# Patient Record
Sex: Male | Born: 1960
Health system: Southern US, Community
[De-identification: ages and names within clinical notes are randomized; demographics above are authoritative.]

## PROBLEM LIST (undated history)

## (undated) DIAGNOSIS — G8929 Other chronic pain: Secondary | ICD-10-CM

## (undated) DIAGNOSIS — I10 Essential (primary) hypertension: Secondary | ICD-10-CM

## (undated) DIAGNOSIS — R519 Headache, unspecified: Secondary | ICD-10-CM

## (undated) DIAGNOSIS — R079 Chest pain, unspecified: Secondary | ICD-10-CM

## (undated) DIAGNOSIS — E785 Hyperlipidemia, unspecified: Secondary | ICD-10-CM

## (undated) HISTORY — DX: Other chronic pain: G89.29

## (undated) HISTORY — DX: Headache, unspecified: R51.9

## (undated) HISTORY — PX: COLONOSCOPY: SHX174

## (undated) HISTORY — DX: Chest pain, unspecified: R07.9

## (undated) HISTORY — PX: NO PAST SURGERIES: SHX2092

## (undated) HISTORY — DX: Hyperlipidemia, unspecified: E78.5

---

## 2015-10-29 ENCOUNTER — Ambulatory Visit (HOSPITAL_COMMUNITY)
Admission: EM | Admit: 2015-10-29 | Discharge: 2015-10-29 | Disposition: A | Discharge status: Home / Self Care | Payer: BLUE CROSS/BLUE SHIELD | Attending: Emergency Medicine | Admitting: Emergency Medicine

## 2015-10-29 ENCOUNTER — Encounter (HOSPITAL_COMMUNITY): Payer: Self-pay | Admitting: Emergency Medicine

## 2015-10-29 DIAGNOSIS — I10 Essential (primary) hypertension: Secondary | ICD-10-CM | POA: Diagnosis not present

## 2015-10-29 HISTORY — DX: Essential (primary) hypertension: I10

## 2015-10-29 MED ORDER — DOXAZOSIN MESYLATE 8 MG PO TABS: 8.0000 mg | ORAL_TABLET | Freq: Every day | ORAL | Status: DC

## 2015-10-29 MED ORDER — SILDENAFIL CITRATE 50 MG PO TABS: 50.0000 mg | ORAL_TABLET | Freq: Every day | ORAL | Status: DC | PRN

## 2015-10-29 NOTE — Discharge Instructions (Signed)
DASH Eating Plan °DASH stands for "Dietary Approaches to Stop Hypertension." The DASH eating plan is a healthy eating plan that has been shown to reduce high blood pressure (hypertension). Additional health benefits may include reducing the risk of type 2 diabetes mellitus, heart disease, and stroke. The DASH eating plan may also help with weight loss. °WHAT DO I NEED TO KNOW ABOUT THE DASH EATING PLAN? °For the DASH eating plan, you will follow these general guidelines: °· Choose foods with a percent daily value for sodium of less than 5% (as listed on the food label). °· Use salt-free seasonings or herbs instead of table salt or sea salt. °· Check with your health care provider or pharmacist before using salt substitutes. °· Eat lower-sodium products, often labeled as "lower sodium" or "no salt added." °· Eat fresh foods. °· Eat more vegetables, fruits, and low-fat dairy products. °· Choose whole grains. Look for the word "whole" as the first word in the ingredient list. °· Choose fish and skinless chicken or turkey more often than red meat. Limit fish, poultry, and meat to 6 oz (170 g) each day. °· Limit sweets, desserts, sugars, and sugary drinks. °· Choose heart-healthy fats. °· Limit cheese to 1 oz (28 g) per day. °· Eat more home-cooked food and less restaurant, buffet, and fast food. °· Limit fried foods. °· Cook foods using methods other than frying. °· Limit canned vegetables. If you do use them, rinse them well to decrease the sodium. °· When eating at a restaurant, ask that your food be prepared with less salt, or no salt if possible. °WHAT FOODS CAN I EAT? °Seek help from a dietitian for individual calorie needs. °Grains °Whole grain or whole wheat bread. Brown rice. Whole grain or whole wheat pasta. Quinoa, bulgur, and whole grain cereals. Low-sodium cereals. Corn or whole wheat flour tortillas. Whole grain cornbread. Whole grain crackers. Low-sodium crackers. °Vegetables °Fresh or frozen vegetables  (raw, steamed, roasted, or grilled). Low-sodium or reduced-sodium tomato and vegetable juices. Low-sodium or reduced-sodium tomato sauce and paste. Low-sodium or reduced-sodium canned vegetables.  °Fruits °All fresh, canned (in natural juice), or frozen fruits. °Meat and Other Protein Products °Ground beef (85% or leaner), grass-fed beef, or beef trimmed of fat. Skinless chicken or turkey. Ground chicken or turkey. Pork trimmed of fat. All fish and seafood. Eggs. Dried beans, peas, or lentils. Unsalted nuts and seeds. Unsalted canned beans. °Dairy °Low-fat dairy products, such as skim or 1% milk, 2% or reduced-fat cheeses, low-fat ricotta or cottage cheese, or plain low-fat yogurt. Low-sodium or reduced-sodium cheeses. °Fats and Oils °Tub margarines without trans fats. Light or reduced-fat mayonnaise and salad dressings (reduced sodium). Avocado. Safflower, olive, or canola oils. Natural peanut or almond butter. °Other °Unsalted popcorn and pretzels. °The items listed above may not be a complete list of recommended foods or beverages. Contact your dietitian for more options. °WHAT FOODS ARE NOT RECOMMENDED? °Grains °White bread. White pasta. White rice. Refined cornbread. Bagels and croissants. Crackers that contain trans fat. °Vegetables °Creamed or fried vegetables. Vegetables in a cheese sauce. Regular canned vegetables. Regular canned tomato sauce and paste. Regular tomato and vegetable juices. °Fruits °Dried fruits. Canned fruit in light or heavy syrup. Fruit juice. °Meat and Other Protein Products °Fatty cuts of meat. Ribs, chicken wings, bacon, sausage, bologna, salami, chitterlings, fatback, hot dogs, bratwurst, and packaged luncheon meats. Salted nuts and seeds. Canned beans with salt. °Dairy °Whole or 2% milk, cream, half-and-half, and cream cheese. Whole-fat or sweetened yogurt. Full-fat   cheeses or blue cheese. Nondairy creamers and whipped toppings. Processed cheese, cheese spreads, or cheese  curds. °Condiments °Onion and garlic salt, seasoned salt, table salt, and sea salt. Canned and packaged gravies. Worcestershire sauce. Tartar sauce. Barbecue sauce. Teriyaki sauce. Soy sauce, including reduced sodium. Steak sauce. Fish sauce. Oyster sauce. Cocktail sauce. Horseradish. Ketchup and mustard. Meat flavorings and tenderizers. Bouillon cubes. Hot sauce. Tabasco sauce. Marinades. Taco seasonings. Relishes. °Fats and Oils °Butter, stick margarine, lard, shortening, ghee, and bacon fat. Coconut, palm kernel, or palm oils. Regular salad dressings. °Other °Pickles and olives. Salted popcorn and pretzels. °The items listed above may not be a complete list of foods and beverages to avoid. Contact your dietitian for more information. °WHERE CAN I FIND MORE INFORMATION? °[employer], [employer]: www.nhlbi.nih.gov/health/health-topics/topics/dash/ °  °This information is not intended to replace advice given to you by your health care provider. Make sure you discuss any questions you have with your health care provider. °  °Document Released: 05/18/2011 Document Revised: 06/19/2014 Document Reviewed: 04/02/2013 °Elsevier Interactive Patient Education ©2016 [employer] ° °Hypertension °Hypertension, commonly called high blood pressure, is when the force of blood pumping through your arteries is too strong. Your arteries are the blood vessels that carry blood from your heart throughout your body. A blood pressure reading consists of a higher number over a lower number, such as 110/72. The higher number (systolic) is the pressure inside your arteries when your heart pumps. The lower number (diastolic) is the pressure inside your arteries when your heart relaxes. Ideally you want your blood pressure below 120/80. °Hypertension forces your heart to work harder to pump blood. Your arteries may become narrow or stiff. Having untreated or uncontrolled hypertension can cause heart attack, stroke, kidney  disease, and other problems. °RISK FACTORS °Some risk factors for high blood pressure are controllable. Others are not.  °Risk factors you cannot control include:  °· Race. You may be at higher risk if you are African American. °· Age. Risk increases with age. °· Gender. Men are at higher risk than women before age 45 years. After age 65, women are at higher risk than men. °Risk factors you can control include: °· Not getting enough exercise or physical activity. °· Being overweight. °· Getting too much fat, sugar, calories, or salt in your diet. °· Drinking too much alcohol. °SIGNS AND SYMPTOMS °Hypertension does not usually cause signs or symptoms. Extremely high blood pressure (hypertensive crisis) may cause headache, anxiety, shortness of breath, and nosebleed. °DIAGNOSIS °To check if you have hypertension, your health care provider will measure your blood pressure while you are seated, with your arm held at the level of your heart. It should be measured at least twice using the same arm. Certain conditions can cause a difference in blood pressure between your right and left arms. A blood pressure reading that is higher than normal on one occasion does not mean that you need treatment. If it is not clear whether you have high blood pressure, you may be asked to return on a different day to have your blood pressure checked again. Or, you may be asked to monitor your blood pressure at home for 1 or more weeks. °TREATMENT °Treating high blood pressure includes making lifestyle changes and possibly taking medicine. Living a healthy lifestyle can help lower high blood pressure. You may need to change some of your habits. °Lifestyle changes may include: °· Following the DASH diet. This diet is high in fruits, vegetables, and whole   grains. It is low in salt, red meat, and added sugars. °· Keep your sodium intake below 2,300 mg per day. °· Getting at least 30-45 minutes of aerobic exercise at least 4 times per  week. °· Losing weight if necessary. °· Not smoking. °· Limiting alcoholic beverages. °· Learning ways to reduce stress. °Your health care provider may prescribe medicine if lifestyle changes are not enough to get your blood pressure under control, and if one of the following is true: °· You are 18-59 years of age and your systolic blood pressure is above 140. °· You are 60 years of age or older, and your systolic blood pressure is above 150. °· Your diastolic blood pressure is above 90. °· You have diabetes, and your systolic blood pressure is over 140 or your diastolic blood pressure is over 90. °· You have kidney disease and your blood pressure is above 140/90. °· You have heart disease and your blood pressure is above 140/90. °Your personal target blood pressure may vary depending on your medical conditions, your age, and other factors. °HOME CARE INSTRUCTIONS °· Have your blood pressure rechecked as directed by your health care provider.   °· Take medicines only as directed by your health care provider. Follow the directions carefully. Blood pressure medicines must be taken as prescribed. The medicine does not work as well when you skip doses. Skipping doses also puts you at risk for problems. °· Do not smoke.   °· Monitor your blood pressure at home as directed by your health care provider.  °SEEK MEDICAL CARE IF:  °· You think you are having a reaction to medicines taken. °· You have recurrent headaches or feel dizzy. °· You have swelling in your ankles. °· You have trouble with your vision. °SEEK IMMEDIATE MEDICAL CARE IF: °· You develop a severe headache or confusion. °· You have unusual weakness, numbness, or feel faint. °· You have severe chest or abdominal pain. °· You vomit repeatedly. °· You have trouble breathing. °MAKE SURE YOU:  °· Understand these instructions. °· Will watch your condition. °· Will get help right away if you are not doing well or get worse. °  °This information is not intended to  replace advice given to you by your health care provider. Make sure you discuss any questions you have with your health care provider. °  °Document Released: 05/29/2005 Document Revised: 10/13/2014 Document Reviewed: 03/21/2013 °Elsevier Interactive Patient Education ©2016 Else[employer] Inc. ° °

## 2015-10-29 NOTE — ED Notes (Signed)
General torso soreness.  Dizziness on Monday, headache on Monday.  Chest soreness x 1 week.  Patient actually reports multiple sites of soreness.  Movement makes soreness worse.  Denies chest pain now.  Patient ran out of blood pressure medicine unknown length of time

## 2015-10-29 NOTE — ED Provider Notes (Signed)
CSN: [id number]     Arrival date & time 10/29/15  1645 History   First MD Initiated Contact with Patient 10/29/15 1809     Chief Complaint  Patient presents with  . Dizziness  . Generalized Body Aches   (Consider location/radiation/quality/duration/timing/severity/associated sxs/prior Treatment) HPI Patient presents with multiple complaints including body aches chest pain and dizziness.  Present for several days by patient's report. 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 He is also out of his antihypertensive medicine. He states he has been out for a couple weeks now. States that his PCP is retired and he does not have a new doctor at this time.   Past Medical History  Diagnosis Date  . Hypertension    History reviewed. No pertinent past surgical history. No family history on file. Social History  Substance Use Topics  . Smoking status: Never Smoker   . Smokeless tobacco: None  . Alcohol Use: Yes    Review of Systems  Denies: HEADACHE, NAUSEA, ABDOMINAL PAIN, CHEST PAIN, CONGESTION, DYSURIA, SHORTNESS OF BREATH  Allergies  Review of patient's allergies indicates no known allergies.  Home Medications   Prior to Admission medications   Not on File   Meds Ordered and Administered this Visit  Medications - No data to display  BP 160/80 mmHg  Pulse 63  Temp(Src) 97.5 F (36.4 C) (Oral)  Resp 16  SpO2 98% No data found.   Physical Exam NURSES NOTES AND VITAL SIGNS REVIEWED. CONSTITUTIONAL: Well developed, well nourished, no acute distress HEENT: normocephalic, atraumatic EYES: Conjunctiva normal NECK:normal ROM, supple, no adenopathy PULMONARY:No respiratory distress, normal effort ABDOMINAL: Soft, ND, NT BS+, No CVAT MUSCULOSKELETAL: Normal ROM of all extremities,  SKIN: warm and dry without rash PSYCHIATRIC: Mood and affect, behavior are normal  ED Course  Procedures (including critical care time)  Labs Review Labs Reviewed - No  data to display  Imaging Review No results found.   Visual Acuity Review  Right Eye Distance:   Left Eye Distance:   Bilateral Distance:    Right Eye Near:   Left Eye Near:    Bilateral Near:      I HAVE PERSONALLY REVIEWED ECG AND DISCUSSED FINDINGS WITH PATIENT.   PA UC INTERPRETATION: NORMAL SINUS RHYTHM WITH RATE OF 58, NO AXIS, OR INTERVAL ABNORMALITIES. NO ACUTE CHANGES.  NO OLD ECG TO COMPARE.  ECG UNCHANGED AS COMPARED TO:  rx FOR DOXAZOSIN 8 MG  MDM   1. Essential hypertension     Patient is reassured that there are no issues that require transfer to higher level of care at this time or additional tests. Patient is advised to continue home symptomatic treatment. Patient is advised that if there are new or worsening symptoms to attend the emergency department, contact primary care provider, or return to UC. Instructions of care provided discharged home in stable condition.    THIS NOTE WAS GENERATED USING A VOICE RECOGNITION SOFTWARE PROGRAM. ALL REASONABLE EFFORTS  WERE MADE TO PROOFREAD THIS DOCUMENT FOR ACCURACY.  I have verbally reviewed the discharge instructions with the patient. A printed AVS was given to the patient.  All questions were answered prior to discharge.      Konrad Felix, PA 10/29/15 [place], [place] 10/29/15 [phone]

## 2017-05-02 ENCOUNTER — Ambulatory Visit (HOSPITAL_COMMUNITY)
Admission: EM | Admit: 2017-05-02 | Discharge: 2017-05-02 | Disposition: A | Discharge status: Home / Self Care | Payer: BLUE CROSS/BLUE SHIELD | Attending: Family Medicine | Admitting: Family Medicine

## 2017-05-02 ENCOUNTER — Encounter (HOSPITAL_COMMUNITY): Payer: Self-pay | Admitting: Emergency Medicine

## 2017-05-02 DIAGNOSIS — R42 Dizziness and giddiness: Secondary | ICD-10-CM

## 2017-05-02 DIAGNOSIS — H81399 Other peripheral vertigo, unspecified ear: Secondary | ICD-10-CM

## 2017-05-02 DIAGNOSIS — R110 Nausea: Secondary | ICD-10-CM

## 2017-05-02 MED ORDER — MECLIZINE HCL 25 MG PO TABS: 25.0000 mg | ORAL_TABLET | Freq: Three times a day (TID) | ORAL | 0 refills | Status: DC | PRN

## 2017-05-02 NOTE — ED Provider Notes (Signed)
[place]    CSN: [id number] Arrival date & time: 05/02/17  1727     History   Chief Complaint Chief Complaint  Patient presents with  . Dizziness    HPI Gregory Benson is a 56 y.o. male.   Gregory Benson presents with complaints of dizziness and nausea which started today at approximately 1400. He states movement and position change caused a spinning sensation. Mild headache. Took blood pressure medication this afternoon. He did drive here, he is ambulatory. Has not vomited. Denies ear pain. Small runny nose. No recent travel. Denies previous similar but states if he drinks coffee it causes dizziness. Denies caffeine intake today. States he may have intermittent chest pain today but denies currently. Has not taken any medications for his symptoms. Has been eating and drinking, without vomiting or diarrhea.   ROS per HPI.       Past Medical History:  Diagnosis Date  . Hypertension     There are no active problems to display for this patient.   History reviewed. No pertinent surgical history.     Home Medications    Prior to Admission medications   Medication Sig Start Date End Date Taking? Authorizing Provider  doxazosin (CARDURA) 8 MG tablet Take 1 tablet (8 mg total) by mouth daily. 10/29/15   Konrad Felix, PA  meclizine (ANTIVERT) 25 MG tablet Take 1 tablet (25 mg total) by mouth 3 (three) times daily as needed for dizziness. 05/02/17   Zigmund Gottron, NP  sildenafil (VIAGRA) 50 MG tablet Take 1 tablet (50 mg total) by mouth daily as needed for erectile dysfunction. 10/29/15   Konrad Felix, PA    Family History History reviewed. No pertinent family history.  Social History Social History   Tobacco Use  . Smoking status: Never Smoker  Substance Use Topics  . Alcohol use: Yes  . Drug use: No     Allergies   Patient has no known allergies.   Review of Systems Review of Systems   Physical Exam Triage Vital Signs ED Triage Vitals  Enc  Vitals Group     BP 05/02/17 1743 (!) 165/91     Pulse Rate 05/02/17 1743 (!) 59     Resp 05/02/17 1743 18     Temp 05/02/17 1743 (!) 97.2 F (36.2 C)     Temp Source 05/02/17 1743 Oral     SpO2 05/02/17 1743 100 %     Weight --      Height --      Head Circumference --      Peak Flow --      Pain Score 05/02/17 1744 0     Pain Loc --      Pain Edu? --      Excl. in [place]? --    Orthostatic VS for the past 24 hrs:  BP- Lying Pulse- Lying BP- Sitting Pulse- Sitting BP- Standing at 0 minutes Pulse- Standing at 0 minutes  05/02/17 1825 138/85 58 168/89 56 151/84 61    Updated Vital Signs BP (!) 165/91 (BP Location: Right Arm)   Pulse (!) 59   Temp (!) 97.2 F (36.2 C) (Oral)   Resp 18   SpO2 100%   Visual Acuity Right Eye Distance:   Left Eye Distance:   Bilateral Distance:    Right Eye Near:   Left Eye Near:    Bilateral Near:     Physical Exam  Constitutional: He is oriented to person, place, and  time. He appears well-developed and well-nourished.  HENT:  Head: Normocephalic and atraumatic.  Right Ear: Tympanic membrane and ear canal normal.  Left Ear: Tympanic membrane and ear canal normal.  Mouth/Throat: Uvula is midline, oropharynx is clear and moist and mucous membranes are normal.  Eyes: Conjunctivae are normal. Pupils are equal, round, and reactive to light.  Neck: Normal range of motion.  Cardiovascular: Regular rhythm.  Pulmonary/Chest: Effort normal and breath sounds normal.  Neurological: He is alert and oriented to person, place, and time. No cranial nerve deficit or sensory deficit. Coordination normal.  Tolerated romberg; ambulatory; denies increased dizziness from sitting to standing during exam  Skin: Skin is warm and dry.  Vitals reviewed.   Negative orthostatic vitals. ekg with bradycardia, without acute findings. No changes from ekg from 10/2015   UC Treatments / Results  Labs (all labs ordered are listed, but only abnormal results are  displayed) Labs Reviewed - No data to display  EKG  EKG Interpretation None       Radiology No results found.  Procedures Procedures (including critical care time)  Medications Ordered in UC Medications - No data to display   Initial Impression / Assessment and Plan / UC Course  I have reviewed the triage vital signs and the nursing notes.  Pertinent labs & imaging results that were available during my care of the patient were reviewed by me and considered in my medical decision making (see chart for details).     Patient non toxic non distressed in appearance throughout exam. Tolerated orthostatic vitals without increased dizziness or without symptoms. ekg without acute changes, consistent with previous ekg. HR bradycardic, appears consistent with visit in 2017. BP improvement, noted with orthostatic vitals. Will try meclizine, push fluids. Return precautions provided. Patient verbalized understanding and agreeable to plan.  If symptoms worsen or do not improve in the next week to return to be seen or to follow up with PCP. Ambulatory out of clinic without difficulty.     Final Clinical Impressions(s) / UC Diagnoses   Final diagnoses:  Vertigo    ED Discharge Orders        Ordered    meclizine (ANTIVERT) 25 MG tablet  3 times daily PRN     05/02/17 1846       Controlled Substance Prescriptions [place] Controlled Substance Registry consulted? Not Applicable   Zigmund Gottron, NP 05/02/17 [phone]

## 2017-05-02 NOTE — Discharge Instructions (Signed)
Please drink plenty of water. May try the medication I have sent to the pharmacy to treat dizziness, may cause drowsiness. Rest. If develop worsening of symptoms, headache, chest pain, loss of consciousness, vomiting please visit Ed. If no improvement of symptoms please return to be seen or establish with a primary care provider.

## 2017-05-02 NOTE — ED Triage Notes (Signed)
Pt sts dizziness x several hours worse with position change and feels that room is spinning; pt sts some nausea also; no other sx

## 2017-07-30 ENCOUNTER — Encounter (HOSPITAL_COMMUNITY): Payer: Self-pay | Admitting: Emergency Medicine

## 2017-07-30 ENCOUNTER — Ambulatory Visit (HOSPITAL_COMMUNITY)
Admission: EM | Admit: 2017-07-30 | Discharge: 2017-07-30 | Disposition: A | Discharge status: Home / Self Care | Payer: Self-pay | Attending: Family Medicine | Admitting: Family Medicine

## 2017-07-30 DIAGNOSIS — Z760 Encounter for issue of repeat prescription: Secondary | ICD-10-CM

## 2017-07-30 DIAGNOSIS — I10 Essential (primary) hypertension: Secondary | ICD-10-CM

## 2017-07-30 MED ORDER — DOXAZOSIN MESYLATE 8 MG PO TABS: 8.0000 mg | ORAL_TABLET | Freq: Every day | ORAL | 0 refills | Status: DC

## 2017-07-30 MED ORDER — TADALAFIL 10 MG PO TABS: 10.0000 mg | ORAL_TABLET | Freq: Every day | ORAL | 0 refills | Status: DC | PRN

## 2017-07-30 NOTE — ED Provider Notes (Signed)
Gregory Benson    CSN: [id number] Arrival date & time: 07/30/17  1004     History   Chief Complaint Chief Complaint  Patient presents with  . Medication Refill    HPI Gregory Benson is a 57 y.o. male history of hypertension presenting today for a medication refill.  He takes 8 mg of doxazosin daily.  He ran out of this a few days ago.  He denies any issues with his medications at this time.  He denies headache, vision changes, chest pain, shortness of breath, decreased urine output.  Patient will occasionally check his blood pressure at [facility] and it runs in the 120s over 60s.  He is also requesting a different medicine for erectile dysfunction, he was given Viagra but states this this was too expensive for him.  HPI  Past Medical History:  Diagnosis Date  . Hypertension     There are no active problems to display for this patient.   History reviewed. No pertinent surgical history.     Home Medications    Prior to Admission medications   Medication Sig Start Date End Date Taking? Authorizing Provider  doxazosin (CARDURA) 8 MG tablet Take 1 tablet (8 mg total) by mouth daily. 07/30/17 08/29/17  Wieters, Hallie C, PA-C  meclizine (ANTIVERT) 25 MG tablet Take 1 tablet (25 mg total) by mouth 3 (three) times daily as needed for dizziness. 05/02/17   Zigmund Gottron, NP  sildenafil (VIAGRA) 50 MG tablet Take 1 tablet (50 mg total) by mouth daily as needed for erectile dysfunction. 10/29/15   Konrad Felix, PA  tadalafil (CIALIS) 10 MG tablet Take 1 tablet (10 mg total) by mouth daily as needed for erectile dysfunction. 07/30/17   Wieters, Elesa Hacker, PA-C    Family History No family history on file.  Social History Social History   Tobacco Use  . Smoking status: Never Smoker  Substance Use Topics  . Alcohol use: Yes  . Drug use: No     Allergies   Patient has no known allergies.   Review of Systems Review of Systems  Constitutional: Negative for fever.    Eyes: Negative for pain and visual disturbance.  Respiratory: Negative for cough and shortness of breath.   Cardiovascular: Negative for chest pain and palpitations.  Gastrointestinal: Negative for abdominal pain and vomiting.  Genitourinary: Negative for decreased urine volume and hematuria.  Musculoskeletal: Positive for back pain and neck pain.  Skin: Negative for color change and rash.  Neurological: Negative for weakness, light-headedness and headaches.  All other systems reviewed and are negative.    Physical Exam Triage Vital Signs ED Triage Vitals  Enc Vitals Group     BP 07/30/17 1031 (!) 138/94     Pulse Rate 07/30/17 1031 69     Resp 07/30/17 1031 18     Temp 07/30/17 1031 97.7 F (36.5 C)     Temp Source 07/30/17 1031 Oral     SpO2 07/30/17 1031 98 %     Weight 07/30/17 1037 180 lb (81.6 kg)     Height --      Head Circumference --      Peak Flow --      Pain Score 07/30/17 1036 0     Pain Loc --      Pain Edu? --      Excl. in [place]? --    No data found.  Updated Vital Signs BP (!) 138/94 (BP Location: Left Arm) Comment: Pt  in for BP med refill  Pulse 69   Temp 97.7 F (36.5 C) (Oral)   Resp 18   Wt 180 lb (81.6 kg)   SpO2 98%   Visual Acuity Right Eye Distance:   Left Eye Distance:   Bilateral Distance:    Right Eye Near:   Left Eye Near:    Bilateral Near:     Physical Exam  Constitutional: He is oriented to person, place, and time. He appears well-developed and well-nourished.  HENT:  Head: Normocephalic and atraumatic.  Eyes: Conjunctivae and EOM are normal. Pupils are equal, round, and reactive to light.  Neck: Neck supple.  Cardiovascular: Normal rate and regular rhythm.  No murmur heard. Pulmonary/Chest: Effort normal and breath sounds normal. No respiratory distress.  Breathing comfortably at rest, clear to auscultation bilaterally  Abdominal: Soft. There is no tenderness.  Musculoskeletal: He exhibits no edema.  Neurological: He is  alert and oriented to person, place, and time.  Skin: Skin is warm and dry.  Psychiatric: He has a normal mood and affect.  Nursing note and vitals reviewed.    UC Treatments / Results  Labs (all labs ordered are listed, but only abnormal results are displayed) Labs Reviewed - No data to display  EKG  EKG Interpretation None       Radiology No results found.  Procedures Procedures (including critical care time)  Medications Ordered in UC Medications - No data to display   Initial Impression / Assessment and Plan / UC Course  I have reviewed the triage vital signs and the nursing notes.  Pertinent labs & imaging results that were available during my care of the patient were reviewed by me and considered in my medical decision making (see chart for details).     Cardura refilled.  Advised him to try to establish care with a primary care provider.  Provided information for community health and wellness.  Patient is currently asymptomatic, blood pressure today 138/94.   Will provide generic Cialis as an alternative to Viagra.  We will see if this is any cheaper for him.  Advised Tylenol and ibuprofen for any neck and back pain he has from working and lifting.  Discussed strict return precautions. Patient verbalized understanding and is agreeable with plan.   Final Clinical Impressions(s) / UC Diagnoses   Final diagnoses:  Medication refill  Essential hypertension    ED Discharge Orders        Ordered    doxazosin (CARDURA) 8 MG tablet  Daily,   Status:  Discontinued     07/30/17 1049    tadalafil (CIALIS) 10 MG tablet  Daily PRN     07/30/17 1049    doxazosin (CARDURA) 8 MG tablet  Daily     07/30/17 1053       Controlled Substance Prescriptions [place] Controlled Substance Registry consulted? Not Applicable   Janith Lima, PA-C 07/30/17 [address], [place], [place] 07/30/17 1059

## 2017-07-30 NOTE — ED Triage Notes (Addendum)
PT ran out of BP meds a few days ago, needs refill  PT would also like a med for ED, he was prescribed viagra but could not afford it.  Would like referral to PCP

## 2017-07-30 NOTE — Discharge Instructions (Signed)
I have refilled your blood pressure medicine. Please continue to take as prescribed and monitor blood pressure at home.   Please take tylenol and/or Ibuprofen for your neck/back aches.  I have sent in another medicine for your ED- please see if this is any cheaper than Viagra. Take as needed 30 minutes prior to sexual activity.   Please try and get set up with a primary care, you may contact community health and wellness (info below).

## 2017-10-22 ENCOUNTER — Ambulatory Visit (HOSPITAL_COMMUNITY)
Admission: EM | Admit: 2017-10-22 | Discharge: 2017-10-22 | Disposition: A | Discharge status: Home / Self Care | Payer: Self-pay | Attending: Family Medicine | Admitting: Family Medicine

## 2017-10-22 ENCOUNTER — Encounter (HOSPITAL_COMMUNITY): Payer: Self-pay | Admitting: Family Medicine

## 2017-10-22 DIAGNOSIS — Z760 Encounter for issue of repeat prescription: Secondary | ICD-10-CM

## 2017-10-22 DIAGNOSIS — I10 Essential (primary) hypertension: Secondary | ICD-10-CM

## 2017-10-22 MED ORDER — DOXAZOSIN MESYLATE 8 MG PO TABS: 8.0000 mg | ORAL_TABLET | Freq: Every day | ORAL | 2 refills | Status: DC

## 2017-10-22 NOTE — ED Triage Notes (Signed)
Pt here for med refill. He is out of BP meds x 2 month.

## 2017-10-22 NOTE — Discharge Instructions (Signed)
I have refilled your blood pressure medicine.  Be sure to monitor at home/[facility]/pharmacies  Please go to Emergency Room if you start to experience severe headache, vision changes, decreased urine production, chest pain, shortness of breath, speech slurring, one sided weakness.

## 2017-10-22 NOTE — ED Provider Notes (Signed)
[facility]    CSN: [id number] Arrival date & time: 10/22/17  1000     History   Chief Complaint Chief Complaint  Patient presents with  . Medication Refill    HPI Gregory Benson is a 57 y.o. male history of hypertension presenting today for medication refill.  Patient takes doxazosin 8 mg daily for his blood pressure.  He has been off his medicines for 3 days.  He denies any issues with his medicines.  Does note that he recently lost his mother and is dealing with grief from this.  He denies any headaches, change in vision, chest pain or difficulty breathing.  Denies any decreased urine output.  Patient does not have a PCP.  States he is going to [place] in about a month and is requesting refills to cover him during this time of travel.  HPI  Past Medical History:  Diagnosis Date  . Hypertension     There are no active problems to display for this patient.   History reviewed. No pertinent surgical history.     Home Medications    Prior to Admission medications   Medication Sig Start Date End Date Taking? Authorizing Provider  doxazosin (CARDURA) 8 MG tablet Take 1 tablet (8 mg total) by mouth daily. 10/22/17 11/21/17  Mintie Witherington C, PA-C  meclizine (ANTIVERT) 25 MG tablet Take 1 tablet (25 mg total) by mouth 3 (three) times daily as needed for dizziness. 05/02/17   Zigmund Gottron, NP  sildenafil (VIAGRA) 50 MG tablet Take 1 tablet (50 mg total) by mouth daily as needed for erectile dysfunction. 10/29/15   Konrad Felix, PA  tadalafil (CIALIS) 10 MG tablet Take 1 tablet (10 mg total) by mouth daily as needed for erectile dysfunction. 07/30/17   Braylyn Kalter, Elesa Hacker, PA-C    Family History No family history on file.  Social History Social History   Tobacco Use  . Smoking status: Never Smoker  . Smokeless tobacco: Never Used  Substance Use Topics  . Alcohol use: Yes  . Drug use: No     Allergies   Patient has no known allergies.   Review of  Systems Review of Systems  Constitutional: Negative for activity change, appetite change, fatigue and fever.  Eyes: Negative for visual disturbance.  Respiratory: Negative for chest tightness and shortness of breath.   Cardiovascular: Negative for chest pain.  Gastrointestinal: Negative for abdominal pain, nausea and vomiting.  Genitourinary: Negative for decreased urine volume.  Musculoskeletal: Negative for myalgias.  Skin: Negative for rash.  Neurological: Negative for syncope, weakness, numbness and headaches.     Physical Exam Triage Vital Signs ED Triage Vitals [10/22/17 1013]  Enc Vitals Group     BP      Pulse      Resp      Temp      Temp src      SpO2      Weight      Height      Head Circumference      Peak Flow      Pain Score 0     Pain Loc      Pain Edu?      Excl. in [place]?    No data found.  Updated Vital Signs BP 126/81   Pulse (!) 57   Temp 98.9 F (37.2 C)   Resp 18   SpO2 98%   Visual Acuity Right Eye Distance:   Left Eye Distance:  Bilateral Distance:    Right Eye Near:   Left Eye Near:    Bilateral Near:     Physical Exam  Constitutional: He is oriented to person, place, and time. He appears well-developed and well-nourished.  Sitting comfortably on exam table, no acute distress  HENT:  Head: Normocephalic and atraumatic.  Eyes: Pupils are equal, round, and reactive to light. Conjunctivae and EOM are normal.  Neck: Neck supple.  Cardiovascular: Normal rate and regular rhythm.  No murmur heard. Pulmonary/Chest: Effort normal and breath sounds normal. No respiratory distress.  Breathing comfortably at rest, CTA BL  Abdominal: Soft. There is no tenderness.  Musculoskeletal: He exhibits no edema.  Neurological: He is alert and oriented to person, place, and time.  Skin: Skin is warm and dry.  Psychiatric: He has a normal mood and affect.  Nursing note and vitals reviewed.    UC Treatments / Results  Labs (all labs ordered are  listed, but only abnormal results are displayed) Labs Reviewed - No data to display  EKG None  Radiology No results found.  Procedures Procedures (including critical care time)  Medications Ordered in UC Medications - No data to display  Initial Impression / Assessment and Plan / UC Course  I have reviewed the triage vital signs and the nursing notes.  Pertinent labs & imaging results that were available during my care of the patient were reviewed by me and considered in my medical decision making (see chart for details).     Refill of doxazosin provided, blood pressure appears controlled at this time.  Provided patient with community health and wellness information to establish care with a PCP. Discussed strict return precautions. Patient verbalized understanding and is agreeable with plan.  Final Clinical Impressions(s) / UC Diagnoses   Final diagnoses:  Medication refill  Essential hypertension     Discharge Instructions     I have refilled your blood pressure medicine.  Be sure to monitor at home/[facility]pharmacies  Please go to Emergency Room if you start to experience severe headache, vision changes, decreased urine production, chest pain, shortness of breath, speech slurring, one sided weakness.     ED Prescriptions    Medication Sig Dispense Auth. Provider   doxazosin (CARDURA) 8 MG tablet Take 1 tablet (8 mg total) by mouth daily. 60 tablet Ellieanna Funderburg, [place] C, PA-C     Controlled Substance Prescriptions [place] Controlled Substance Registry consulted? Not Applicable   Janith Lima, [place] 10/22/17 1033

## 2018-06-24 ENCOUNTER — Other Ambulatory Visit: Payer: Self-pay

## 2018-06-24 ENCOUNTER — Encounter (HOSPITAL_COMMUNITY): Payer: Self-pay | Admitting: Emergency Medicine

## 2018-06-24 ENCOUNTER — Ambulatory Visit (HOSPITAL_COMMUNITY)
Admission: EM | Admit: 2018-06-24 | Discharge: 2018-06-24 | Disposition: A | Discharge status: Home / Self Care | Payer: Self-pay | Attending: Urgent Care | Admitting: Urgent Care

## 2018-06-24 DIAGNOSIS — Z760 Encounter for issue of repeat prescription: Secondary | ICD-10-CM | POA: Insufficient documentation

## 2018-06-24 DIAGNOSIS — I10 Essential (primary) hypertension: Secondary | ICD-10-CM | POA: Insufficient documentation

## 2018-06-24 MED ORDER — DOXAZOSIN MESYLATE 8 MG PO TABS: 8.0000 mg | ORAL_TABLET | Freq: Every day | ORAL | 0 refills | Status: DC

## 2018-06-24 NOTE — Discharge Instructions (Addendum)
[facility] can help you with your primary care needs.  [facility] Address: [address], [place], [place] [address] Phone: [phone]   For your travel, please report to the Health Department to get your vaccines.  [facility] Department Address: [address] [place], [place] [address] Phone: [phone] Website: [email or web address]

## 2018-06-24 NOTE — ED Triage Notes (Signed)
Pt states he has been out of his BP medication for a few days.  Pt has no job or insurance so he does not have a PCP.  Pt also states he is going to [place] on Sunday for a few months and is wanting to get medication to keep him from getting Malaria while over there.

## 2018-06-24 NOTE — ED Provider Notes (Signed)
  MRN: [id number] DOB: 01/15/1961  Subjective:   Gregory Benson is a 58 y.o. male presenting for medication refill.  He is on doxazosin and does very well with this.  He is requesting a 86-month supply as he is going to [place] and does not know when he will return.  He is also requesting immunizations.  He does not have a primary care doctor, does not have insurance.  No current facility-administered medications for this encounter.   Current Outpatient Medications:  .  doxazosin (CARDURA) 8 MG tablet, Take 1 tablet (8 mg total) by mouth daily., Disp: 180 tablet, Rfl: 0 .  sildenafil (VIAGRA) 50 MG tablet, Take 1 tablet (50 mg total) by mouth daily as needed for erectile dysfunction., Disp: 10 tablet, Rfl: 0 .  tadalafil (CIALIS) 10 MG tablet, Take 1 tablet (10 mg total) by mouth daily as needed for erectile dysfunction., Disp: 15 tablet, Rfl: 0   No Known Allergies  Past Medical History:  Diagnosis Date  . Hypertension     History reviewed. No pertinent surgical history.  Objective:   Vitals: BP 117/71 (BP Location: Left Arm)   Pulse 70   Temp 99.2 F (37.3 C) (Oral)   SpO2 99%   Physical Exam Constitutional:      Appearance: Normal appearance. He is well-developed and normal weight.  HENT:     Head: Normocephalic and atraumatic.     Right Ear: External ear normal.     Left Ear: External ear normal.     Nose: Nose normal.     Mouth/Throat:     Pharynx: Oropharynx is clear.  Eyes:     Extraocular Movements: Extraocular movements intact.     Pupils: Pupils are equal, round, and reactive to light.  Cardiovascular:     Rate and Rhythm: Normal rate.  Pulmonary:     Effort: Pulmonary effort is normal.  Neurological:     Mental Status: He is alert and oriented to person, place, and time.  Psychiatric:        Mood and Affect: Mood normal.        Behavior: Behavior normal.    Assessment and Plan :   Medication refill  Essential hypertension  I counseled patient that has an  urgent care practice, we cannot safely treat his primary care needs.  I did refill his blood pressure medication for 180 days with the understanding that he would establish care with [facility] for his future primary care needs.  I redirected him to the health department to obtain immunization counseling for his travels. Return-to-clinic precautions discussed, patient verbalized understanding.    Jaynee Eagles, PA-C 06/24/18 1058

## 2019-02-27 ENCOUNTER — Encounter (HOSPITAL_COMMUNITY): Payer: Self-pay | Admitting: Emergency Medicine

## 2019-02-27 ENCOUNTER — Ambulatory Visit (HOSPITAL_COMMUNITY)
Admission: EM | Admit: 2019-02-27 | Discharge: 2019-02-27 | Disposition: A | Discharge status: Home / Self Care | Payer: Self-pay | Attending: Family Medicine | Admitting: Family Medicine

## 2019-02-27 ENCOUNTER — Other Ambulatory Visit: Payer: Self-pay

## 2019-02-27 DIAGNOSIS — R21 Rash and other nonspecific skin eruption: Secondary | ICD-10-CM

## 2019-02-27 DIAGNOSIS — N529 Male erectile dysfunction, unspecified: Secondary | ICD-10-CM

## 2019-02-27 DIAGNOSIS — I10 Essential (primary) hypertension: Secondary | ICD-10-CM

## 2019-02-27 LAB — LIPID PANEL
Cholesterol: 198 mg/dL (ref 0–200)
HDL: 60 mg/dL (ref >40)
LDL Cholesterol: 120 mg/dL — ABNORMAL HIGH (ref 0–99)
Total CHOL/HDL Ratio: 3.3 RATIO
Triglycerides: 89 mg/dL (ref <150)
VLDL: 18 mg/dL (ref 0–40)

## 2019-02-27 LAB — COMPREHENSIVE METABOLIC PANEL
ALT: 15 U/L (ref 0–44)
AST: 27 U/L (ref 15–41)
Albumin: 4 g/dL (ref 3.5–5.0)
Alkaline Phosphatase: 55 U/L (ref 38–126)
Anion gap: 7 (ref 5–15)
BUN: 12 mg/dL (ref 6–20)
CO2: 27 mmol/L (ref 22–32)
Calcium: 9.6 mg/dL (ref 8.9–10.3)
Chloride: 106 mmol/L (ref 98–111)
Creatinine, Ser: 0.96 mg/dL (ref 0.61–1.24)
GFR calc Af Amer: >60 mL/min (ref >60)
GFR calc non Af Amer: >60 mL/min (ref >60)
Glucose, Bld: 123 mg/dL — ABNORMAL HIGH (ref 70–99)
Potassium: 3.3 mmol/L — ABNORMAL LOW (ref 3.5–5.1)
Sodium: 140 mmol/L (ref 135–145)
Total Bilirubin: 1.2 mg/dL (ref 0.3–1.2)
Total Protein: 6.8 g/dL (ref 6.5–8.1)

## 2019-02-27 MED ORDER — DOXAZOSIN MESYLATE 8 MG PO TABS: 8.0000 mg | ORAL_TABLET | Freq: Every day | ORAL | 1 refills | Status: DC

## 2019-02-27 MED ORDER — SILDENAFIL CITRATE 50 MG PO TABS: 50.0000 mg | ORAL_TABLET | Freq: Every day | ORAL | 11 refills | Status: DC | PRN

## 2019-02-27 MED ORDER — CLOTRIMAZOLE-BETAMETHASONE 1-0.05 % EX CREA: TOPICAL_CREAM | CUTANEOUS | 1 refills | Status: DC

## 2019-02-27 NOTE — ED Provider Notes (Signed)
Gregory Benson    CSN: [id number] Arrival date & time: 02/27/19  1121      History   Chief Complaint Chief Complaint  Patient presents with  . Medication Reaction  . Rash    HPI Gregory Benson is a 58 y.o. male.    Pt requesting medication refill on his blood pressure medicine. Also states he had his hair cut at the barber shop and broke out with dry skin/rash on top of his head.   Patient has been here before for blood pressure check.  Today he has a rash on his scalp which is been going on for a number of months.  It is itchy and scaly.  He is not using any medications for present.  Patient also has erectile dysfunction.  This is an ongoing problem for which Viagra has worked for him.  Patient also has asked about getting blood work to make sure that his "internal organs" are not being affected by the hypertension.  Patient works for [employer] but is been furloughed.       Past Medical History:  Diagnosis Date  . Hypertension     There are no active problems to display for this patient.   History reviewed. No pertinent surgical history.     Home Medications    Prior to Admission medications   Medication Sig Start Date End Date Taking? Authorizing Provider  clotrimazole-betamethasone (LOTRISONE) cream Apply to affected area 2 times daily prn 02/27/19   Robyn Haber, MD  doxazosin (CARDURA) 8 MG tablet Take 1 tablet (8 mg total) by mouth daily. 02/27/19   Robyn Haber, MD  sildenafil (VIAGRA) 50 MG tablet Take 1 tablet (50 mg total) by mouth daily as needed for erectile dysfunction. 02/27/19   Robyn Haber, MD  tadalafil (CIALIS) 10 MG tablet Take 1 tablet (10 mg total) by mouth daily as needed for erectile dysfunction. 07/30/17   Wieters, Elesa Hacker, PA-C    Family History No family history on file.  Social History Social History   Tobacco Use  . Smoking status: Never Smoker  . Smokeless tobacco: Never Used  Substance Use Topics  .  Alcohol use: Yes  . Drug use: No     Allergies   Patient has no known allergies.   Review of Systems Review of Systems  Skin: Positive for rash.  All other systems reviewed and are negative.    Physical Exam Triage Vital Signs ED Triage Vitals  Enc Vitals Group     BP 02/27/19 1143 139/82     Pulse Rate 02/27/19 1143 88     Resp 02/27/19 1143 18     Temp 02/27/19 1143 98 F (36.7 C)     Temp src --      SpO2 02/27/19 1143 100 %     Weight --      Height --      Head Circumference --      Peak Flow --      Pain Score 02/27/19 1145 0     Pain Loc --      Pain Edu? --      Excl. in [place]? --    No data found.  Updated Vital Signs BP 139/82   Pulse 88   Temp 98 F (36.7 C)   Resp 18   SpO2 100%    Physical Exam Vitals signs and nursing note reviewed.  Constitutional:      General: He is not in acute distress.  Appearance: Normal appearance. He is normal weight. He is not ill-appearing.  HENT:     Head: Atraumatic.     Mouth/Throat:     Mouth: Mucous membranes are moist.  Eyes:     Conjunctiva/sclera: Conjunctivae normal.  Neck:     Musculoskeletal: Normal range of motion and neck supple.     Vascular: No carotid bruit.  Cardiovascular:     Rate and Rhythm: Normal rate and regular rhythm.     Pulses: Normal pulses.     Heart sounds: Normal heart sounds.  Pulmonary:     Effort: Pulmonary effort is normal.     Breath sounds: Normal breath sounds.  Musculoskeletal: Normal range of motion.  Skin:    General: Skin is warm and dry.     Findings: Rash present.     Comments: Diffuse scalp rash under close cropped hair:  Scaly excoriated diffusely  Neurological:     General: No focal deficit present.     Mental Status: He is alert.  Psychiatric:        Mood and Affect: Mood normal.      UC Treatments / Results  Labs (all labs ordered are listed, but only abnormal results are displayed) Labs Reviewed  COMPREHENSIVE METABOLIC PANEL  LIPID PANEL     EKG   Radiology No results found.  Procedures Procedures (including critical care time)  Medications Ordered in UC Medications - No data to display  Initial Impression / Assessment and Plan / UC Course  I have reviewed the triage vital signs and the nursing notes.  Pertinent labs & imaging results that were available during my care of the patient were reviewed by me and considered in my medical decision making (see chart for details).    Final Clinical Impressions(s) / UC Diagnoses   Final diagnoses:  Rash  Essential hypertension  Erectile dysfunction, unspecified erectile dysfunction type   Discharge Instructions   None    ED Prescriptions    Medication Sig Dispense Auth. Provider   doxazosin (CARDURA) 8 MG tablet Take 1 tablet (8 mg total) by mouth daily. 180 tablet Robyn Haber, MD   sildenafil (VIAGRA) 50 MG tablet Take 1 tablet (50 mg total) by mouth daily as needed for erectile dysfunction. 10 tablet Robyn Haber, MD   clotrimazole-betamethasone (LOTRISONE) cream Apply to affected area 2 times daily prn 45 g Robyn Haber, MD     Controlled Substance Prescriptions [place] Controlled Substance Registry consulted? Not Applicable   Robyn Haber, MD 02/27/19 1159

## 2019-02-27 NOTE — ED Triage Notes (Signed)
Pt requesting medication refill on his blood pressure medicine. Also states he had his hair cut at the barber shop and broke out with dry skin/rash on top of his head.

## 2019-02-28 ENCOUNTER — Telehealth (HOSPITAL_COMMUNITY): Payer: Self-pay | Admitting: Emergency Medicine

## 2019-02-28 NOTE — Telephone Encounter (Signed)
Per Dr. Meda Coffee, pt needs increase potassium in diet. Attempted to reach patient. No answer at this time. Voicemail left.

## 2019-03-03 ENCOUNTER — Telehealth (HOSPITAL_COMMUNITY): Payer: Self-pay | Admitting: Emergency Medicine

## 2019-03-03 NOTE — Telephone Encounter (Signed)
Attempted once more to contact patient about bloodwork, no answer.

## 2019-07-23 ENCOUNTER — Other Ambulatory Visit: Payer: Self-pay

## 2019-07-23 ENCOUNTER — Encounter (HOSPITAL_COMMUNITY): Payer: Self-pay

## 2019-07-23 ENCOUNTER — Ambulatory Visit (HOSPITAL_COMMUNITY)
Admission: EM | Admit: 2019-07-23 | Discharge: 2019-07-23 | Disposition: A | Discharge status: Home / Self Care | Payer: 59 | Attending: Physician Assistant | Admitting: Physician Assistant

## 2019-07-23 DIAGNOSIS — M62830 Muscle spasm of back: Secondary | ICD-10-CM | POA: Diagnosis present

## 2019-07-23 DIAGNOSIS — M545 Low back pain, unspecified: Secondary | ICD-10-CM

## 2019-07-23 DIAGNOSIS — Z20822 Contact with and (suspected) exposure to covid-19: Secondary | ICD-10-CM

## 2019-07-23 MED ORDER — MELOXICAM 7.5 MG PO TABS: 7.5000 mg | ORAL_TABLET | Freq: Every day | ORAL | 0 refills | Status: DC

## 2019-07-23 MED ORDER — TIZANIDINE HCL 4 MG PO TABS: 4.0000 mg | ORAL_TABLET | Freq: Every evening | ORAL | 0 refills | Status: DC | PRN

## 2019-07-23 MED ORDER — SILDENAFIL CITRATE 50 MG PO TABS: 50.0000 mg | ORAL_TABLET | ORAL | 1 refills | Status: DC | PRN

## 2019-07-23 NOTE — ED Provider Notes (Signed)
[place]   MRN: [id number] DOB: 1961/04/22  Subjective:   Gregory Benson is a 59 y.o. male presenting for 9-month history of persistent intermittent lower back pain on either side.  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  States that he has to twist all the way around to drive in the reverse and this elicits his back pain.  After work he has a hard time rising from sitting position.  When he is off he does not have back pain.  He did have some COVID-19 contacts at work and would like to make sure he does not have this.  No current facility-administered medications for this encounter.  Current Outpatient Medications:  .  clotrimazole-betamethasone (LOTRISONE) cream, Apply to affected area 2 times daily prn, Disp: 45 g, Rfl: 1 .  doxazosin (CARDURA) 8 MG tablet, Take 1 tablet (8 mg total) by mouth daily., Disp: 180 tablet, Rfl: 1 .  sildenafil (VIAGRA) 50 MG tablet, Take 1 tablet (50 mg total) by mouth daily as needed for erectile dysfunction., Disp: 10 tablet, Rfl: 11 .  tadalafil (CIALIS) 10 MG tablet, Take 1 tablet (10 mg total) by mouth daily as needed for erectile dysfunction., Disp: 15 tablet, Rfl: 0   No Known Allergies  Past Medical History:  Diagnosis Date  . Hypertension      History reviewed. No pertinent surgical history.  History reviewed. No pertinent family history.  Social History   Tobacco Use  . Smoking status: Never Smoker  . Smokeless tobacco: Never Used  Substance Use Topics  . Alcohol use: Yes  . Drug use: No    ROS Denies fever, nausea, vomiting, belly pain, hematuria, dysuria, weakness, history of renal stone, falls, trauma.  Denies cough, chest pain, shortness of breath.  Objective:   Vitals: BP (!) 142/86 (BP Location: Right Arm)   Pulse 68   Temp 98.3 F (36.8 C) (Oral)   Resp 18   SpO2 98%   Physical Exam Constitutional:      General: He  is not in acute distress.    Appearance: Normal appearance. He is well-developed and normal weight. He is not ill-appearing, toxic-appearing or diaphoretic.  HENT:     Head: Normocephalic and atraumatic.     Right Ear: External ear normal.     Left Ear: External ear normal.     Nose: Nose normal.     Mouth/Throat:     Pharynx: Oropharynx is clear.  Eyes:     General: No scleral icterus.       Right eye: No discharge.        Left eye: No discharge.     Extraocular Movements: Extraocular movements intact.     Pupils: Pupils are equal, round, and reactive to light.  Cardiovascular:     Rate and Rhythm: Normal rate.  Pulmonary:     Effort: Pulmonary effort is normal.  Musculoskeletal:     Cervical back: Normal range of motion.     Lumbar back: Spasms present. No swelling, edema, deformity, signs of trauma, lacerations, tenderness or bony tenderness. Normal range of motion. Negative right straight leg raise test and negative left straight leg raise test. No scoliosis.     Right lower leg: No edema.     Left lower leg: No edema.     Comments: Strength 5/5 for lower extremities bilaterally.  No tenderness elicited on exam.  Patient does have great range of motion.  There is significant spasms of lumbar paraspinal muscles.  Skin:    General: Skin is warm and dry.  Neurological:     Mental Status: He is alert and oriented to person, place, and time.     Motor: No weakness.     Coordination: Coordination normal.     Deep Tendon Reflexes: Reflexes normal.  Psychiatric:        Mood and Affect: Mood normal.        Behavior: Behavior normal.        Thought Content: Thought content normal.        Judgment: Judgment normal.      Assessment and Plan :   1. Acute bilateral low back pain without sciatica   2. Muscle spasm of back   3. Exposure to COVID-19 virus     Patient is to use meloxicam and tizanidine for his back pains.  Recommended he hydrate better.  He does not have alarming  physical exam findings or HPI.  Counseled that his work is likely the source of his back pain and recommended discussing different work task with his employer.  COVID-19 testing is pending.  At the end of his visit patient wanted to discuss erectile dysfunction.  I refilled his sildenafil, redirected him to a new PCP.  Information provided to the patient. Counseled patient on potential for adverse effects with medications prescribed/recommended today, ER and return-to-clinic precautions discussed, patient verbalized understanding.    Jaynee Eagles, [place] 07/23/19 1927

## 2019-07-23 NOTE — ED Triage Notes (Signed)
Pt presents to UC with lower back pain x 2 months, worse when getting up. Pt requested COVID test.

## 2019-07-25 LAB — NOVEL CORONAVIRUS, NAA (HOSP ORDER, SEND-OUT TO REF LAB; TAT 18-24 HRS): SARS-CoV-2, NAA: NOT DETECTED

## 2020-03-30 ENCOUNTER — Ambulatory Visit: Payer: 59 | Admitting: Family Medicine

## 2020-04-02 ENCOUNTER — Encounter: Payer: Self-pay | Admitting: Family Medicine

## 2020-04-02 ENCOUNTER — Ambulatory Visit (INDEPENDENT_AMBULATORY_CARE_PROVIDER_SITE_OTHER): Payer: 59 | Admitting: Family Medicine

## 2020-04-02 ENCOUNTER — Other Ambulatory Visit: Payer: Self-pay | Admitting: Family Medicine

## 2020-04-02 ENCOUNTER — Other Ambulatory Visit: Payer: Self-pay

## 2020-04-02 VITALS — BP 114/78 | HR 80 | Ht 69.75 in | Wt 197.0 lb

## 2020-04-02 DIAGNOSIS — Z114 Encounter for screening for human immunodeficiency virus [HIV]: Secondary | ICD-10-CM

## 2020-04-02 DIAGNOSIS — N529 Male erectile dysfunction, unspecified: Secondary | ICD-10-CM | POA: Insufficient documentation

## 2020-04-02 DIAGNOSIS — I10 Essential (primary) hypertension: Secondary | ICD-10-CM

## 2020-04-02 DIAGNOSIS — Z1211 Encounter for screening for malignant neoplasm of colon: Secondary | ICD-10-CM

## 2020-04-02 DIAGNOSIS — D171 Benign lipomatous neoplasm of skin and subcutaneous tissue of trunk: Secondary | ICD-10-CM

## 2020-04-02 DIAGNOSIS — Z13220 Encounter for screening for lipoid disorders: Secondary | ICD-10-CM

## 2020-04-02 DIAGNOSIS — Z5320 Procedure and treatment not carried out because of patient's decision for unspecified reasons: Secondary | ICD-10-CM | POA: Diagnosis not present

## 2020-04-02 DIAGNOSIS — Z23 Encounter for immunization: Secondary | ICD-10-CM

## 2020-04-02 DIAGNOSIS — G8929 Other chronic pain: Secondary | ICD-10-CM | POA: Insufficient documentation

## 2020-04-02 DIAGNOSIS — M545 Low back pain, unspecified: Secondary | ICD-10-CM

## 2020-04-02 DIAGNOSIS — Z131 Encounter for screening for diabetes mellitus: Secondary | ICD-10-CM

## 2020-04-02 DIAGNOSIS — N528 Other male erectile dysfunction: Secondary | ICD-10-CM

## 2020-04-02 DIAGNOSIS — D179 Benign lipomatous neoplasm, unspecified: Secondary | ICD-10-CM | POA: Insufficient documentation

## 2020-04-02 MED ORDER — SILDENAFIL CITRATE 50 MG PO TABS: 50.0000 mg | ORAL_TABLET | ORAL | 1 refills | Status: DC | PRN

## 2020-04-02 MED FILL — SILDENAFIL CITRATE 50 MG TA: 50 | 10 days supply | Qty: 10 | Fill #0

## 2020-04-02 NOTE — Assessment & Plan Note (Signed)
Chart review shows a history of hypertension although his blood pressure at today's visit is within normal limits at 114/78.  He is not currently taking any medications and I do not plan to start any medications today.  We will continue to monitor his blood pressure at follow-up visits.

## 2020-04-02 NOTE — Patient Instructions (Addendum)
We addressed a number of things today.  I realize she still had additional questions about back pain which we were not able to get to.  Erectile dysfunction: I am happy to refill your sildenafil.  I am also giving you some tests for other vascular problems like diabetes and high cholesterol.  I will let you know if anything concerning.  Back pain: I am happy to have a more in-depth discussion about this if you like to return for a visit.  Based on the brief exam and discussion today, a low suspicion for bone fractures or serious issues.  Screening for colon cancer: You should get a call in the next 1-2 weeks about setting up an appointment for a colonoscopy.

## 2020-04-02 NOTE — Progress Notes (Signed)
SUBJECTIVE:   CHIEF COMPLAINT / HPI:   Gregory Benson presented to clinic today to establish care.  His medical and surgical history will filled out through the history tab.  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  This primarily bothers him when he has to spend long periods of time operating the forklift.  Erectile dysfunction This has been an issue for him for years.  He has previously been prescribed Cialis and Viagra.  His primary issue is having a relatively flaccid erection which has been helped by medication in the past.  He would like a refill of his previous medicine.  Health maintenance He has never previously been screened for colon cancer and he is interested in colon cancer testing today.  Lump on back He notes that he has a large, fist-sized lump on his back that has been there for years.  It does not particularly bother him and is not painful but he recognizes it is abnormal and wants to know if this is something concerning that should be assessed.  He has not noted any particular changes in size recently.  Does not get inflamed, irritated or drained.  PERTINENT  PMH / PSH: No smoking history.  Not currently sexually active.  Previously married, currently separated.  OBJECTIVE:   BP 114/78   Pulse 80   Ht 5' 9.75" (1.772 m)   Wt 197 lb (89.4 kg)   SpO2 97%   BMI 28.47 kg/m    General: Alert and cooperative and appears to be in no acute distress HEENT: Neck non-tender without lymphadenopathy, masses or thyromegaly Cardio: Normal S1 and S2, no S3 or S4. Rhythm is regular. No murmurs or rubs.   Pulm: Clear to auscultation bilaterally, no crackles, wheezing, or diminished breath sounds. Normal respiratory effort Abdomen: Bowel sounds normal. Abdomen soft and non-tender.  Exam of his back reveals a mass just  inferior to his left scapula just roughly 10 cm x 15 cm.  The mass is nontender.  There is no evidence of skin changes, erythema, purulence.  The mass is mobile. Extremities: No peripheral edema. Warm/ well perfused.  Strong radial pulse. Neuro: Cranial nerves grossly intact  POCUS: Ultrasound of the mass of his left back reveals no evidence of fluid accumulation or inflammation and findings consistent with adipose tissue.  ASSESSMENT/PLAN:   Lipoma He was informed that the history, physical and ultrasound are all consistent with a lipoma.  This is a benign mass and does not require treatment unless it becomes bothersome.  He was informed that I would refer him to a surgeon for removal of this particular lipoma because it is quite large.  Erectile dysfunction He was informed this is likely an indication that he does have some significant vascular disease. -Viagra refilled -Follow-up A1c -Follow-up lipid panel -Continue to not smoke  Hypertension Chart review shows a history of hypertension although his blood pressure at today's visit is within normal limits at 114/78.  He is not currently taking any medications and I do not plan to start any medications today.  We will continue to monitor his blood pressure at follow-up visits.  Chronic bilateral low back pain We did not have time to fully evaluate his low back pain although it does sound like a chronic issue without any new acute findings.  Physical  exam showed no bony tenderness or sciatica.  Was informed this is likely benign but he could return to clinic if he wanted additional assessment and treatment options.   Healthcare maintenance -Placed ambulatory referral to GI for colonoscopy -Flu vaccine and tetanus vaccine administered today -Follow-up HIV and hep C testing  Gregory Haymaker, MD [place]

## 2020-04-02 NOTE — Assessment & Plan Note (Signed)
He was informed that the history, physical and ultrasound are all consistent with a lipoma.  This is a benign mass and does not require treatment unless it becomes bothersome.  He was informed that I would refer him to a surgeon for removal of this particular lipoma because it is quite large.

## 2020-04-02 NOTE — Assessment & Plan Note (Signed)
He was informed this is likely an indication that he does have some significant vascular disease. -Viagra refilled -Follow-up A1c -Follow-up lipid panel -Continue to not smoke

## 2020-04-02 NOTE — Assessment & Plan Note (Signed)
We did not have time to fully evaluate his low back pain although it does sound like a chronic issue without any new acute findings.  Physical exam showed no bony tenderness or sciatica.  Was informed this is likely benign but he could return to clinic if he wanted additional assessment and treatment options.

## 2020-04-03 LAB — LIPID PANEL
Chol/HDL Ratio: 3.3 ratio (ref 0.0–5.0)
Cholesterol, Total: 169 mg/dL (ref 100–199)
HDL: 52 mg/dL (ref >39)
LDL Chol Calc (NIH): 104 mg/dL — ABNORMAL HIGH (ref 0–99)
Triglycerides: 68 mg/dL (ref 0–149)
VLDL Cholesterol Cal: 13 mg/dL (ref 5–40)

## 2020-04-03 LAB — HEMOGLOBIN A1C
Est. average glucose Bld gHb Est-mCnc: 94 mg/dL
Hgb A1c MFr Bld: 4.9 % (ref 4.8–5.6)

## 2020-04-03 LAB — HEPATITIS C ANTIBODY: Hep C Virus Ab: 0.1 s/co ratio (ref 0.0–0.9)

## 2020-04-03 LAB — HIV ANTIBODY (ROUTINE TESTING W REFLEX): HIV Screen 4th Generation wRfx: NONREACTIVE

## 2020-04-06 ENCOUNTER — Other Ambulatory Visit: Payer: Self-pay | Admitting: Family Medicine

## 2020-04-12 ENCOUNTER — Other Ambulatory Visit: Payer: Self-pay | Admitting: Family Medicine

## 2020-04-15 ENCOUNTER — Other Ambulatory Visit: Payer: Self-pay | Admitting: Family Medicine

## 2020-04-15 NOTE — Progress Notes (Signed)
Called to discuss elevated cholesterol and his ASCVD 10-year risk of 9.5.  He had many questions about his elevated cholesterol and the best way forward.  He was encouraged to follow-up in clinic for more thorough discussion and has additional questions.

## 2020-04-21 ENCOUNTER — Encounter: Payer: Self-pay | Admitting: Internal Medicine

## 2020-05-26 ENCOUNTER — Other Ambulatory Visit: Payer: Self-pay | Admitting: Internal Medicine

## 2020-05-26 ENCOUNTER — Ambulatory Visit (AMBULATORY_SURGERY_CENTER): Payer: Self-pay | Admitting: *Deleted

## 2020-05-26 ENCOUNTER — Other Ambulatory Visit: Payer: Self-pay

## 2020-05-26 VITALS — Ht 69.75 in | Wt 198.0 lb

## 2020-05-26 DIAGNOSIS — Z1211 Encounter for screening for malignant neoplasm of colon: Secondary | ICD-10-CM

## 2020-05-26 MED ORDER — SUTAB 1479-225-188 MG PO TABS: 1.0000 | ORAL_TABLET | ORAL | 0 refills | Status: DC

## 2020-05-26 MED FILL — SUTAB 1479-225-188 MG TABS: 1479-225-18 | 2 days supply | Qty: 24 | Fill #0

## 2020-05-26 NOTE — Progress Notes (Signed)
Patient is here in-person for PV. Patient denies any allergies to eggs or soy. Patient denies any problems with anesthesia/sedation. Patient denies any oxygen use at home. Patient denies taking any diet/weight loss medications or blood thinners. Patient is not being treated for MRSA or C-diff. Patient is aware of our care-partner policy and HNGIT-19 safety protocol. EMMI education assigned to the patient for the procedure, hand out given to pt.   COVID-19 vaccines completed on 11/06/19, per patient.   Sutab Prep Prescription coupon given to the patient. Pt aware of cost.

## 2020-05-28 ENCOUNTER — Encounter: Payer: Self-pay | Admitting: Internal Medicine

## 2020-06-09 ENCOUNTER — Encounter: Payer: Self-pay | Admitting: Internal Medicine

## 2020-06-09 ENCOUNTER — Ambulatory Visit (AMBULATORY_SURGERY_CENTER): Payer: 59 | Admitting: Internal Medicine

## 2020-06-09 ENCOUNTER — Other Ambulatory Visit: Payer: Self-pay

## 2020-06-09 VITALS — BP 117/77 | HR 60 | Temp 98.0°F | Resp 16 | Ht 69.75 in | Wt 198.0 lb

## 2020-06-09 DIAGNOSIS — D120 Benign neoplasm of cecum: Secondary | ICD-10-CM

## 2020-06-09 DIAGNOSIS — D122 Benign neoplasm of ascending colon: Secondary | ICD-10-CM

## 2020-06-09 DIAGNOSIS — D125 Benign neoplasm of sigmoid colon: Secondary | ICD-10-CM

## 2020-06-09 DIAGNOSIS — D123 Benign neoplasm of transverse colon: Secondary | ICD-10-CM | POA: Diagnosis not present

## 2020-06-09 DIAGNOSIS — Z1211 Encounter for screening for malignant neoplasm of colon: Secondary | ICD-10-CM

## 2020-06-09 MED ORDER — SODIUM CHLORIDE 0.9 % IV SOLN
500.0000 mL | Freq: Once | INTRAVENOUS | Status: DC
Start: 2020-06-09 — End: 2020-06-09

## 2020-06-09 NOTE — Progress Notes (Signed)
Report given to PACU, vss 

## 2020-06-09 NOTE — Op Note (Signed)
[facility] Patient Name: Gregory Benson Procedure Date: 06/09/2020 10:38 AM MRN: [id number] Endoscopist: Beverley Fiedler , MD Age: 59 Referring MD:  Date of Birth: 1960/09/02 Gender: Male Account #: [id number] Procedure:                Colonoscopy Indications:              Screening for colorectal malignant neoplasm, This                            is the patient's first colonoscopy Medicines:                Monitored Anesthesia Care Procedure:                Pre-Anesthesia Assessment:                           - Prior to the procedure, a History and Physical                            was performed, and patient medications and                            allergies were reviewed. The patient's tolerance of                            previous anesthesia was also reviewed. The risks                            and benefits of the procedure and the sedation                            options and risks were discussed with the patient.                            All questions were answered, and informed consent                            was obtained. Prior Anticoagulants: The patient has                            taken no previous anticoagulant or antiplatelet                            agents. ASA Grade Assessment: II - A patient with                            mild systemic disease. After reviewing the risks                            and benefits, the patient was deemed in                            satisfactory condition to undergo the procedure.  After obtaining informed consent, the colonoscope                            was passed under direct vision. Throughout the                            procedure, the patient's blood pressure, pulse, and                            oxygen saturations were monitored continuously. The                            Olympus CF-HQ190L (Serial# [id number]) Colonoscope was                            introduced through the anus and  advanced to the                            cecum, identified by appendiceal orifice and                            ileocecal valve. The colonoscopy was performed                            without difficulty. The patient tolerated the                            procedure well. The quality of the bowel                            preparation was good. The ileocecal valve,                            appendiceal orifice, and rectum were photographed. Scope In: 10:50:56 AM Scope Out: 11:09:57 AM Scope Withdrawal Time: 0 hours 16 minutes 18 seconds  Total Procedure Duration: 0 hours 19 minutes 1 second  Findings:                 The digital rectal exam was normal.                           A 4 mm polyp was found in the cecum. The polyp was                            sessile. The polyp was removed with a cold snare.                            Resection and retrieval were complete.                           Five sessile polyps were found in the ascending                            colon. The polyps were 2 to 5  mm in size. These                            polyps were removed with a cold snare. Resection                            and retrieval were complete.                           A 4 mm polyp was found in the transverse colon. The                            polyp was sessile. The polyp was removed with a                            cold snare. Resection and retrieval were complete.                           A 3 mm polyp was found in the sigmoid colon. The                            polyp was sessile. The polyp was removed with a                            cold snare. Resection and retrieval were complete.                           The retroflexed view of the distal rectum and anal                            verge was normal and showed no anal or rectal                            abnormalities. Complications:            No immediate complications. Estimated Blood Loss:     Estimated blood loss  was minimal. Impression:               - One 4 mm polyp in the cecum, removed with a cold                            snare. Resected and retrieved.                           - Five 2 to 5 mm polyps in the ascending colon,                            removed with a cold snare. Resected and retrieved.                           - One 4 mm polyp in the transverse colon, removed  with a cold snare. Resected and retrieved.                           - One 3 mm polyp in the sigmoid colon, removed with                            a cold snare. Resected and retrieved.                           - The distal rectum and anal verge are normal on                            retroflexion view. Recommendation:           - Patient has a contact number available for                            emergencies. The signs and symptoms of potential                            delayed complications were discussed with the                            patient. Return to normal activities tomorrow.                            Written discharge instructions were provided to the                            patient.                           - Resume previous diet.                           - Continue present medications.                           - Await pathology results.                           - Repeat colonoscopy is recommended for                            surveillance. The colonoscopy date will be                            determined after pathology results from today's                            exam become available for review. Gregory Bears, MD 06/09/2020 11:17:12 AM This report has been signed electronically.

## 2020-06-09 NOTE — Patient Instructions (Addendum)
Handout was given to you on polyps. You may resume your current medications today. Await biopsy results.  May take 2-3 weeks to receive pathology results. Please call if any questions or concerns.    YOU HAD AN ENDOSCOPIC PROCEDURE TODAY AT THE [facility]:   Refer to the procedure report that was given to you for any specific questions about what was found during the examination.  If the procedure report does not answer your questions, please call your gastroenterologist to clarify.  If you requested that your care partner not be given the details of your procedure findings, then the procedure report has been included in a sealed envelope for you to review at your convenience later.  YOU SHOULD EXPECT: Some feelings of bloating in the abdomen. Passage of more gas than usual.  Walking can help get rid of the air that was put into your GI tract during the procedure and reduce the bloating. If you had a lower endoscopy (such as a colonoscopy or flexible sigmoidoscopy) you may notice spotting of blood in your stool or on the toilet paper. If you underwent a bowel prep for your procedure, you may not have a normal bowel movement for a few days.  Please Note:  You might notice some irritation and congestion in your nose or some drainage.  This is from the oxygen used during your procedure.  There is no need for concern and it should clear up in a day or so.  SYMPTOMS TO REPORT IMMEDIATELY:   Following lower endoscopy (colonoscopy or flexible sigmoidoscopy):  Excessive amounts of blood in the stool  Significant tenderness or worsening of abdominal pains  Swelling of the abdomen that is new, acute  Fever of 100F or higher    For urgent or emergent issues, a gastroenterologist can be reached at any hour by calling [phone]. Do not use MyChart messaging for urgent concerns.    DIET:  We do recommend a small meal at first, but then you may proceed to your regular diet.  Drink  plenty of fluids but you should avoid alcoholic beverages for 24 hours.  ACTIVITY:  You should plan to take it easy for the rest of today and you should NOT DRIVE or use heavy machinery until tomorrow (because of the sedation medicines used during the test).    FOLLOW UP: Our staff will call the number listed on your records 48-72 hours following your procedure to check on you and address any questions or concerns that you may have regarding the information given to you following your procedure. If we do not reach you, we will leave a message.  We will attempt to reach you two times.  During this call, we will ask if you have developed any symptoms of COVID 19. If you develop any symptoms (ie: fever, flu-like symptoms, shortness of breath, cough etc.) before then, please call [phone].  If you test positive for Covid 19 in the 2 weeks post procedure, please call and report this information to [place].    If any biopsies were taken you will be contacted by phone or by letter within the next 1-3 weeks.  Please call us at [phone] if you have not heard about the biopsies in 3 weeks.    SIGNATURES/CONFIDENTIALITY: You and/or your care partner have signed paperwork which will be entered into your electronic medical record.  These signatures attest to the fact that that the information above on your After Visit Summary has been reviewed  and is understood.  Full responsibility of the confidentiality of this discharge information lies with you and/or your care-partner. 

## 2020-06-09 NOTE — Progress Notes (Signed)
Called to room to assist during endoscopic procedure.  Patient ID and intended procedure confirmed with present staff. Received instructions for my participation in the procedure from the performing physician.  

## 2020-06-09 NOTE — Progress Notes (Signed)
Per Dr. Rhea Belton, pt nneds a note for work.  Note given to pt on discharge.  No problems noted in the recovery room.  Pt did not remember speaking with Dr. Rhea Belton.  He waited and Dr. Rhea Belton spoke with pt again before discharge.  maw

## 2020-06-09 NOTE — Progress Notes (Signed)
Pt's states no medical or surgical changes since previsit or office visit.  Pt unsure about when he took his last medications.  He states he stopped Cardura sometime last week because he thought he should for today's procedure.  BP 163/104.  Encouraged pt to not stop BP meds, he stated understanding.  VS AG

## 2020-06-14 ENCOUNTER — Telehealth: Payer: Self-pay

## 2020-06-14 ENCOUNTER — Telehealth: Payer: Self-pay | Admitting: *Deleted

## 2020-06-14 NOTE — Telephone Encounter (Signed)
Called [phone] and left a message we tried to reach pt for a follow up call. maw

## 2020-06-14 NOTE — Telephone Encounter (Signed)
  Follow up Call-  Call back number 06/09/2020  Post procedure Call Back phone  # [phone]  Permission to leave phone message Yes  Some recent data might be hidden     Patient questions:  Do you have a fever, pain , or abdominal swelling? No. Pain Score  0 *  Have you tolerated food without any problems? Yes.    Have you been able to return to your normal activities? Yes.    Do you have any questions about your discharge instructions: Diet   No. Medications  No. Follow up visit  No.  Do you have questions or concerns about your Care? No.  Actions: * If pain score is 4 or above: No action needed, pain <4.   1. Have you developed a fever since your procedure? NO  2.   Have you had an respiratory symptoms (SOB or cough) since your procedure? NO  3.   Have you tested positive for COVID 19 since your procedure NO  4.   Have you had any family members/close contacts diagnosed with the COVID 19 since your procedure?  NO   If yes to any of these questions please route to Laverna Peace, RN and Karlton Lemon, RN

## 2020-06-16 ENCOUNTER — Encounter: Payer: Self-pay | Admitting: Internal Medicine

## 2020-07-07 ENCOUNTER — Other Ambulatory Visit: Payer: Self-pay | Admitting: Family Medicine

## 2020-07-07 ENCOUNTER — Telehealth: Payer: Self-pay | Admitting: General Practice

## 2020-07-07 NOTE — Telephone Encounter (Signed)
Pt walked in to request refill of:  Name of Medication(s):  Cardura and Clotrimazole  Last date of OV: 04/02/20  Pharmacy:    Will route refill request to Clinic RN.  Discussed with patient policy to call pharmacy for future refills.  Also, discussed refills may take up to 48 hours to approve or deny.  Creig Hines

## 2020-07-14 ENCOUNTER — Telehealth: Payer: Self-pay

## 2020-07-14 NOTE — Telephone Encounter (Signed)
Pt came into office today requesting refills on Doxasosin 8mg  and Betamethasone  Cream 1%/0.05% base to be called in to the [facility]

## 2020-07-16 NOTE — Telephone Encounter (Signed)
2 attempts made to contact pt.  MV not set up.

## 2020-07-19 NOTE — Telephone Encounter (Signed)
3 attempts made to call pt. No answer.  Will deny medication.

## 2020-09-03 ENCOUNTER — Encounter: Payer: Self-pay | Admitting: Family Medicine

## 2020-09-03 ENCOUNTER — Ambulatory Visit (INDEPENDENT_AMBULATORY_CARE_PROVIDER_SITE_OTHER): Payer: 59 | Admitting: Family Medicine

## 2020-09-03 ENCOUNTER — Other Ambulatory Visit: Payer: Self-pay | Admitting: Family Medicine

## 2020-09-03 ENCOUNTER — Other Ambulatory Visit: Payer: Self-pay

## 2020-09-03 ENCOUNTER — Ambulatory Visit
Admission: RE | Admit: 2020-09-03 | Discharge: 2020-09-03 | Disposition: A | Discharge status: Home / Self Care | Payer: 59 | Source: Ambulatory Visit | Attending: Family Medicine | Admitting: Family Medicine

## 2020-09-03 VITALS — BP 138/80 | HR 82 | Ht 70.0 in | Wt 200.5 lb

## 2020-09-03 DIAGNOSIS — G8929 Other chronic pain: Secondary | ICD-10-CM

## 2020-09-03 DIAGNOSIS — N528 Other male erectile dysfunction: Secondary | ICD-10-CM | POA: Diagnosis not present

## 2020-09-03 DIAGNOSIS — I10 Essential (primary) hypertension: Secondary | ICD-10-CM

## 2020-09-03 DIAGNOSIS — M545 Low back pain, unspecified: Secondary | ICD-10-CM

## 2020-09-03 DIAGNOSIS — L299 Pruritus, unspecified: Secondary | ICD-10-CM | POA: Insufficient documentation

## 2020-09-03 MED ORDER — CLOTRIMAZOLE 1 % EX CREA: 1.0000 "application " | TOPICAL_CREAM | Freq: Two times a day (BID) | CUTANEOUS | 0 refills | Status: DC

## 2020-09-03 MED ORDER — SILDENAFIL CITRATE 50 MG PO TABS
50.0000 mg | ORAL_TABLET | ORAL | 1 refills | Status: DC | PRN
Start: 2020-09-03 — End: 2020-09-03

## 2020-09-03 MED ORDER — CHLORTHALIDONE 25 MG PO TABS: 25.0000 mg | ORAL_TABLET | Freq: Every day | ORAL | 1 refills | Status: DC

## 2020-09-03 MED FILL — SILDENAFIL CITRATE 50 MG TA: 50 | 10 days supply | Qty: 10 | Fill #0

## 2020-09-03 MED FILL — CHLORTHALIDONE 25 MG TABS: 25 | 30 days supply | Qty: 30 | Fill #0

## 2020-09-03 NOTE — Patient Instructions (Signed)
It was great to see you today.  Here is a quick review of the things we talked about:   Blood pressure: Please stop taking your Cardura, also called doxazosin, we are going to start a medication today called chlorthalidone.  Likely come back to clinic in 1 week so we can check your blood work.  Please come back to clinic in 1 month to see me and check your blood pressure.  Erectile dysfunction: I refilled this medication  Scalp itching: I recommend that you use Selsun Blue every day for 2 weeks.  I have also sent in a prescription for an antifungal cream for you to use if you like.  Back pain: We will get a back x-ray today.  I suspect this is back pain that will not go away.   If all of your labs are normal, I will send you a message over my chart or send you a letter.  If there is anything to discuss, I will give you a phone call.
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
## 2020-09-03 NOTE — Assessment & Plan Note (Signed)
Due to misunderstanding, I did not realize he was on doxazosin for blood pressure.  Today, we stopped his doxazosin and have started a medication called chlorthalidone. -Stop doxazosin -Start chlorthalidone 25 mg daily -Follow-up BMP today -Return to clinic in 1 week for repeat BMP

## 2020-09-03 NOTE — Assessment & Plan Note (Signed)
No recent significant changes.  No lumbar imaging on file. -Follow-up lumbar x-ray -Continue NSAIDs for pain relief as needed

## 2020-09-03 NOTE — Assessment & Plan Note (Signed)
Possibly dandruff or seborrhea.  He was encouraged to continue using [facility] for a total of 2 weeks at a time to see if that helps with his itching scalp.  He was also given a refill on an antifungal which she was encouraged to use a week at a time if he chose to use that.

## 2020-09-03 NOTE — Assessment & Plan Note (Signed)
Current dose working well. -Sildenafil refilled

## 2020-09-04 LAB — BASIC METABOLIC PANEL
BUN/Creatinine Ratio: 13 (ref 9–20)
BUN: 15 mg/dL (ref 6–24)
CO2: 24 mmol/L (ref 20–29)
Calcium: 10.3 mg/dL — ABNORMAL HIGH (ref 8.7–10.2)
Chloride: 102 mmol/L (ref 96–106)
Creatinine, Ser: 1.12 mg/dL (ref 0.76–1.27)
Glucose: 92 mg/dL (ref 65–99)
Potassium: 4.1 mmol/L (ref 3.5–5.2)
Sodium: 143 mmol/L (ref 134–144)
eGFR: 76 mL/min/{1.73_m2} (ref >59)

## 2020-09-07 ENCOUNTER — Other Ambulatory Visit: Payer: Self-pay | Admitting: Family Medicine

## 2020-09-09 ENCOUNTER — Other Ambulatory Visit: Payer: Self-pay | Admitting: Family Medicine

## 2020-09-09 DIAGNOSIS — M8580 Other specified disorders of bone density and structure, unspecified site: Secondary | ICD-10-CM

## 2020-09-09 NOTE — Progress Notes (Signed)
Mr. Gregory Benson was called and informed that there is no evidence of acute fracture on his x-ray and no concerning findings in his blood work.  I will add on a vitamin D and PTH to his lab work coming up in 1 week.  I will see him again in clinic in 1 month.  Matilde Haymaker, MD

## 2020-10-22 ENCOUNTER — Other Ambulatory Visit (HOSPITAL_COMMUNITY): Payer: Self-pay

## 2020-10-22 MED FILL — Chlorthalidone Tab 25 MG: ORAL | 30 days supply | Qty: 30 | Fill #0 | Status: AC

## 2021-06-23 ENCOUNTER — Ambulatory Visit (HOSPITAL_COMMUNITY)
Admission: EM | Admit: 2021-06-23 | Discharge: 2021-06-23 | Disposition: A | Discharge status: Home / Self Care | Payer: 59 | Attending: Family Medicine | Admitting: Family Medicine

## 2021-06-23 ENCOUNTER — Other Ambulatory Visit: Payer: Self-pay

## 2021-06-23 ENCOUNTER — Telehealth: Payer: Self-pay | Admitting: Student

## 2021-06-23 ENCOUNTER — Other Ambulatory Visit (HOSPITAL_COMMUNITY): Payer: Self-pay

## 2021-06-23 ENCOUNTER — Encounter (HOSPITAL_COMMUNITY): Payer: Self-pay | Admitting: Emergency Medicine

## 2021-06-23 DIAGNOSIS — R519 Headache, unspecified: Secondary | ICD-10-CM

## 2021-06-23 DIAGNOSIS — I10 Essential (primary) hypertension: Secondary | ICD-10-CM | POA: Diagnosis not present

## 2021-06-23 MED ORDER — CHLORTHALIDONE 25 MG PO TABS
25.0000 mg | ORAL_TABLET | Freq: Every day | ORAL | 0 refills | Status: DC
  Filled 2021-06-23: qty 30, 30d supply, fill #0

## 2021-06-23 NOTE — Telephone Encounter (Signed)
Patient walked in to request refill of:  Name of Medication(s):  BP Last date of OV:  09/03/20 Pharmacy:  Same   Will route refill request to Clinic RN.  Discussed with patient policy to call pharmacy for future refills.  Also, discussed refills may take up to 48 hours to approve or deny.  Gregory Benson Please call pt

## 2021-06-23 NOTE — Discharge Instructions (Addendum)
Restart your chlorthalidone 25 mg 1 daily.  You can take Tylenol 500 mg 2 tabs every 4 hours as needed for headache.  See your primary doctor in February as scheduled

## 2021-06-23 NOTE — ED Provider Notes (Signed)
[place]    CSN: [id number] Arrival date & time: 06/23/21  1048      History   Chief Complaint Chief Complaint  Patient presents with   Headache    HPI Gregory Benson is a 61 y.o. male.    Headache Here for h/a that's been bothering him off and on since 1/9. Improved with tylenol that first day. Maybe had some runny nose that day, but it's resolved. Then noted some h/a when he awoke the next 2 days, and this AM when awoke he had some vertigo, now resolved. No LOC, no other neuro symptoms.  Has been out of his chlorthalidone for some months. States no SE, but when he quit taking it, he thought it might help his ED.  BP here is 154/89.  Past Medical History:  Diagnosis Date   Hypertension     Patient Active Problem List   Diagnosis Date Noted   Itching 09/03/2020   Lipoma 04/02/2020   Erectile dysfunction 04/02/2020   Hypertension 04/02/2020   Chronic bilateral low back pain 04/02/2020    Past Surgical History:  Procedure Laterality Date   NO PAST SURGERIES         Home Medications    Prior to Admission medications   Medication Sig Start Date End Date Taking? Authorizing Provider  chlorthalidone (HYGROTON) 25 MG tablet Take 1 tablet (25 mg total) by mouth daily. 06/23/21  Yes Barrett Henle, MD  Multiple Vitamin (MULTIVITAMIN ADULT PO) Take by mouth.    [provider]    Family History Family History  Problem Relation Age of Onset   Stroke Father    Heart disease Brother    Stroke Paternal Grandfather    Colon cancer Neg Hx    Colon polyps Neg Hx    Esophageal cancer Neg Hx    Rectal cancer Neg Hx    Stomach cancer Neg Hx     Social History Social History   Tobacco Use   Smoking status: Never   Smokeless tobacco: Never  Vaping Use   Vaping Use: Never used  Substance Use Topics   Alcohol use: Yes    Comment: occ wine   Drug use: No     Allergies   Patient has no known allergies.   Review of Systems Review of  Systems  Neurological:  Positive for headaches.    Physical Exam Triage Vital Signs ED Triage Vitals  Enc Vitals Group     BP 06/23/21 1209 (!) 154/89     Pulse Rate 06/23/21 1209 75     Resp 06/23/21 1209 18     Temp 06/23/21 1209 98.5 F (36.9 C)     Temp Source 06/23/21 1209 Oral     SpO2 06/23/21 1209 98 %     Weight --      Height --      Head Circumference --      Peak Flow --      Pain Score 06/23/21 1206 4     Pain Loc --      Pain Edu? --      Excl. in [place]? --    No data found.  Updated Vital Signs BP (!) 154/89 (BP Location: Right Arm)    Pulse 75    Temp 98.5 F (36.9 C) (Oral)    Resp 18    SpO2 98%   Visual Acuity Right Eye Distance:   Left Eye Distance:   Bilateral Distance:    Right  Eye Near:   Left Eye Near:    Bilateral Near:     Physical Exam Vitals reviewed.  Constitutional:      General: He is not in acute distress.    Appearance: He is not ill-appearing, toxic-appearing or diaphoretic.  HENT:     Nose: Nose normal.     Mouth/Throat:     Mouth: Mucous membranes are moist.     Pharynx: No oropharyngeal exudate or posterior oropharyngeal erythema.  Eyes:     Extraocular Movements: Extraocular movements intact.     Conjunctiva/sclera: Conjunctivae normal.     Pupils: Pupils are equal, round, and reactive to light.  Cardiovascular:     Rate and Rhythm: Normal rate and regular rhythm.     Heart sounds: No murmur heard. Pulmonary:     Effort: Pulmonary effort is normal.     Breath sounds: Normal breath sounds.  Musculoskeletal:     Cervical back: Neck supple.  Lymphadenopathy:     Cervical: No cervical adenopathy.  Skin:    Capillary Refill: Capillary refill takes less than 2 seconds.     Coloration: Skin is not jaundiced or pale.  Neurological:     General: No focal deficit present.     Mental Status: He is alert and oriented to person, place, and time.  Psychiatric:        Behavior: Behavior normal.     UC Treatments / Results   Labs (all labs ordered are listed, but only abnormal results are displayed) Labs Reviewed - No data to display  EKG   Radiology No results found.  Procedures Procedures (including critical care time)  Medications Ordered in UC Medications - No data to display  Initial Impression / Assessment and Plan / UC Course  I have reviewed the triage vital signs and the nursing notes.  Pertinent labs & imaging results that were available during my care of the patient were reviewed by me and considered in my medical decision making (see chart for details).     Will restart his chlorthalidone. He has an appt with his primary clinic for 2/9. Final Clinical Impressions(s) / UC Diagnoses   Final diagnoses:  Nonintractable headache, unspecified chronicity pattern, unspecified headache type  Essential hypertension   Discharge Instructions   None    ED Prescriptions     Medication Sig Dispense Auth. Provider   chlorthalidone (HYGROTON) 25 MG tablet Take 1 tablet (25 mg total) by mouth daily. 30 tablet Triana Coover, Gwenlyn Perking, MD      PDMP not reviewed this encounter.   Barrett Henle, MD 06/23/21 1255

## 2021-06-23 NOTE — ED Triage Notes (Signed)
Headache, dizzy.  Patient has been out of blood pressure medicine for longer than he can remember.  Headache started Tuesday, dizziness started today.  Patient speaks of visual changes.  Unable to focus on tv, looked like ceiling fan was moving (fan was not on)

## 2021-07-18 NOTE — Progress Notes (Signed)
° ° °  SUBJECTIVE:   CHIEF COMPLAINT / HPI:   HTN  Current medications of chlorthalidone 25 mg daily. Went to UC for headache and was restarted on chlorthalidone after being out for a month.  He said he stopped taking his blood pressure medication because he was worried about his electrolytes. 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  Erectile Dysfunction Sildenafil worked for him in the past but initially did not want to be on medications.  Discussed with him options of vacuum devices as well and patient opted for continuing sildenafil.  Urinary frequency Patient says that he has been having increased urinary frequency when he is awake" when it is cold outside".  Said that he does not have increased thirst and has not had significant weight gain since last visit (only 5 pounds up). Denied any dysuria.  PERTINENT  PMH / PSH: HTN, ED  OBJECTIVE:   BP 130/79    Pulse 84    Ht 5\' 10"  (1.778 m)    Wt 205 lb (93 kg)    SpO2 100%    BMI 29.41 kg/m   General: Well appearing, NAD, awake, alert, responsive to questions Head: Normocephalic atraumatic CV: Regular rate and rhythm no murmurs rubs or gallops Respiratory: Clear to ausculation bilaterally no increased work of breathing Abdomen: Soft, non-tender, non-distended  Extremities: Moves upper and lower extremities freely, no edema in LE  ASSESSMENT/PLAN:   Erectile dysfunction Discussed with patient options of vacuum erectile assistance devices he was wanting a nonmedicine option and patient was not interested in that.  He says that he is interested in restarting his sildenafil. -Filled sildenafil 50 mg as needed (dose he was on previously) -Follow-up at future visits  Hypertension Blood pressure today was 130/79, improved from urgent care visit. -Refilled chlorthalidone 25 mg daily -BMP -Monitor  Urinary frequency Patient said he has been having increased  urinary frequency.  No dysuria.  No increased thirst. -Monitor glucose on BMP (A1c last year was 4.9) -PSA discussed with patient and agreeable -Follow-up  Screening for cholesterol level - Lipid panel   Gregory Heck, MD [place]

## 2021-07-18 NOTE — Patient Instructions (Signed)
It was great to see you! Thank you for allowing me to participate in your care!   I recommend that you always bring your medications to each appointment as this makes it easy to ensure we are on the correct medications and helps [place] not miss when refills are needed.  Our plans for today:  - Checking your labs today for electrolytes/kidney function, cholesterol and PSA - We are going to continue you on you blood pressure medication that you have been taking - for ED vacuum erectile devices are a non-medicine option to look into as well if not wanting to do the viagra  We are checking some labs today, I will call you if they are abnormal will send you a MyChart message or a letter if they are normal.  If you do not hear about your labs in the next 2 weeks please let us know.  Take care and seek immediate care sooner if you develop any concerns. Please remember to show up 15 minutes before your scheduled appointment time!  Gerrit Heck, MD [place]

## 2021-07-21 ENCOUNTER — Other Ambulatory Visit (HOSPITAL_COMMUNITY): Payer: Self-pay

## 2021-07-21 ENCOUNTER — Other Ambulatory Visit: Payer: Self-pay

## 2021-07-21 ENCOUNTER — Encounter: Payer: Self-pay | Admitting: Student

## 2021-07-21 ENCOUNTER — Ambulatory Visit (INDEPENDENT_AMBULATORY_CARE_PROVIDER_SITE_OTHER): Payer: 59 | Admitting: Student

## 2021-07-21 VITALS — BP 130/79 | HR 84 | Ht 70.0 in | Wt 205.0 lb

## 2021-07-21 DIAGNOSIS — I10 Essential (primary) hypertension: Secondary | ICD-10-CM | POA: Diagnosis not present

## 2021-07-21 DIAGNOSIS — Z13220 Encounter for screening for lipoid disorders: Secondary | ICD-10-CM | POA: Diagnosis not present

## 2021-07-21 DIAGNOSIS — R350 Frequency of micturition: Secondary | ICD-10-CM | POA: Insufficient documentation

## 2021-07-21 DIAGNOSIS — E785 Hyperlipidemia, unspecified: Secondary | ICD-10-CM | POA: Insufficient documentation

## 2021-07-21 DIAGNOSIS — N528 Other male erectile dysfunction: Secondary | ICD-10-CM | POA: Diagnosis not present

## 2021-07-21 DIAGNOSIS — Z125 Encounter for screening for malignant neoplasm of prostate: Secondary | ICD-10-CM

## 2021-07-21 MED ORDER — CHLORTHALIDONE 25 MG PO TABS
25.0000 mg | ORAL_TABLET | Freq: Every day | ORAL | 0 refills | Status: DC
  Filled 2021-07-21: qty 30, 30d supply, fill #0

## 2021-07-21 MED ORDER — SILDENAFIL CITRATE 100 MG PO TABS
50.0000 mg | ORAL_TABLET | Freq: Every day | ORAL | 1 refills | Status: AC | PRN
  Filled 2021-07-21: qty 10, 20d supply, fill #0

## 2021-07-21 NOTE — Assessment & Plan Note (Addendum)
Blood pressure today was 130/79, improved from urgent care visit. -Refilled chlorthalidone 25 mg daily -BMP -Monitor

## 2021-07-21 NOTE — Assessment & Plan Note (Signed)
Discussed with patient options of vacuum erectile assistance devices he was wanting a nonmedicine option and patient was not interested in that.  He says that he is interested in restarting his sildenafil. -Filled sildenafil 50 mg as needed (dose he was on previously) -Follow-up at future visits

## 2021-07-21 NOTE — Assessment & Plan Note (Signed)
Lipid panel 

## 2021-07-21 NOTE — Assessment & Plan Note (Addendum)
Patient said he has been having increased urinary frequency.  No dysuria.  No increased thirst. -Monitor glucose on BMP (A1c last year was 4.9) -PSA discussed with patient and agreeable -Follow-up

## 2021-07-22 LAB — BASIC METABOLIC PANEL
BUN/Creatinine Ratio: 17 (ref 9–20)
BUN: 18 mg/dL (ref 6–24)
CO2: 24 mmol/L (ref 20–29)
Calcium: 10.1 mg/dL (ref 8.7–10.2)
Chloride: 102 mmol/L (ref 96–106)
Creatinine, Ser: 1.06 mg/dL (ref 0.76–1.27)
Glucose: 122 mg/dL — ABNORMAL HIGH (ref 70–99)
Potassium: 3.6 mmol/L (ref 3.5–5.2)
Sodium: 145 mmol/L — ABNORMAL HIGH (ref 134–144)
eGFR: 81 mL/min/{1.73_m2} (ref >59)

## 2021-07-22 LAB — LIPID PANEL
Chol/HDL Ratio: 3.5 ratio (ref 0.0–5.0)
Cholesterol, Total: 191 mg/dL (ref 100–199)
HDL: 55 mg/dL (ref >39)
LDL Chol Calc (NIH): 109 mg/dL — ABNORMAL HIGH (ref 0–99)
Triglycerides: 156 mg/dL — ABNORMAL HIGH (ref 0–149)
VLDL Cholesterol Cal: 27 mg/dL (ref 5–40)

## 2021-07-22 LAB — PSA: Prostate Specific Ag, Serum: 0.4 ng/mL (ref 0.0–4.0)

## 2021-07-25 ENCOUNTER — Encounter: Payer: Self-pay | Admitting: Student

## 2021-09-08 ENCOUNTER — Telehealth: Payer: Self-pay | Admitting: Student

## 2021-09-08 NOTE — Telephone Encounter (Signed)
Patient came in stating that he is out of his [place], and needs a refill if possible. ?

## 2021-09-09 ENCOUNTER — Encounter: Payer: Self-pay | Admitting: Family Medicine

## 2021-09-09 ENCOUNTER — Ambulatory Visit (INDEPENDENT_AMBULATORY_CARE_PROVIDER_SITE_OTHER): Payer: 59 | Admitting: Family Medicine

## 2021-09-09 ENCOUNTER — Other Ambulatory Visit (HOSPITAL_COMMUNITY): Payer: Self-pay

## 2021-09-09 ENCOUNTER — Other Ambulatory Visit: Payer: Self-pay | Admitting: Student

## 2021-09-09 VITALS — BP 140/93 | HR 72 | Ht 70.0 in | Wt 203.0 lb

## 2021-09-09 DIAGNOSIS — I10 Essential (primary) hypertension: Secondary | ICD-10-CM

## 2021-09-09 DIAGNOSIS — E785 Hyperlipidemia, unspecified: Secondary | ICD-10-CM

## 2021-09-09 MED ORDER — ATORVASTATIN CALCIUM 20 MG PO TABS
20.0000 mg | ORAL_TABLET | Freq: Every day | ORAL | 3 refills | Status: DC
  Filled 2021-09-09: qty 90, 90d supply, fill #0
  Filled 2021-12-15: qty 90, 90d supply, fill #1
  Filled 2022-03-15: qty 90, 90d supply, fill #2
  Filled 2022-06-16: qty 90, 90d supply, fill #3

## 2021-09-09 MED ORDER — CHLORTHALIDONE 25 MG PO TABS
25.0000 mg | ORAL_TABLET | Freq: Every day | ORAL | 3 refills | Status: DC
  Filled 2021-09-09: qty 90, 90d supply, fill #0
  Filled 2021-12-15: qty 90, 90d supply, fill #1
  Filled 2022-03-15: qty 90, 90d supply, fill #2
  Filled 2022-06-16: qty 90, 90d supply, fill #3

## 2021-09-09 NOTE — Progress Notes (Signed)
? ? ?  SUBJECTIVE:  ? ?CHIEF COMPLAINT / HPI:  ? ?"Discuss test results": ?61 year old male presenting to discuss results from his previous appointment. At that appointment he had a BMP, lipid panel, PSA checked.  His BMP was essentially normal.  Triglycerides elevated at 156, LDL elevated at 109, PSA within normal limits of 0.4.  ASCVD risk of 12.7%.  His PCP recommended following up to discuss this as well as the consideration for statin medication.  Today he states he wants to have a conversation about if he needs to have cholesterol medicine or not.  He states he has concerns about medications. ? ?Hypertension: ?States he ran out of his chlorthalidone.  He would like refill on this.. ? ?PERTINENT  PMH / PSH: HTN ? ?OBJECTIVE:  ? ?BP (!) 140/93   Pulse 72   Ht '5\' 10"'$  (1.778 m)   Wt 203 lb (92.1 kg)   SpO2 98%   BMI 29.13 kg/m?   ? ?138/86 bp on manual recheck ? ?General: NAD, pleasant, able to participate in exam ?Respiratory: No respiratory distress ?Skin: warm and dry, no rashes noted ?Psych: Normal affect and mood  ? ?ASSESSMENT/PLAN:  ? ?No problem-specific Assessment & Plan notes found for this encounter. ?  ?Hyperlipidemia: ?LDL elevated at 109.  ASCVD risk 12.7%.  Discussed consideration for initiating statin medication.  We will start atorvastatin 20 mg daily.  Recommend following up in about 6 months for direct LDL.  Counseled on diet and exercise. ? ?Hypertension: ?Provide refill on chlorthalidone.  Blood pressure of 140/93, 138/86 on manual recheck.  This is in the setting of him running out of his medications.  Recommended starting the chlorthalidone and following up in about 2 weeks for blood pressure check to make sure it is at the right level. ? ?Lurline Del, DO ?[place]  ? ? ? ?

## 2021-09-09 NOTE — Patient Instructions (Signed)
For your high cholesterol level we are starting a medication called atorvastatin.  You should take this each day.  If you have any concerns or questions about it please let us know. ? ?Have sent a refill for your blood pressure medicine for 1 years worth.  I would like for you to come back in about 2 weeks for blood pressure check to simply make sure that your blood pressure is appropriate on that medicine. ?

## 2021-09-23 ENCOUNTER — Other Ambulatory Visit (HOSPITAL_COMMUNITY): Payer: Self-pay

## 2021-09-23 ENCOUNTER — Ambulatory Visit (INDEPENDENT_AMBULATORY_CARE_PROVIDER_SITE_OTHER): Payer: 59 | Admitting: Family Medicine

## 2021-09-23 VITALS — BP 125/75 | HR 76 | Wt 203.4 lb

## 2021-09-23 DIAGNOSIS — L219 Seborrheic dermatitis, unspecified: Secondary | ICD-10-CM

## 2021-09-23 DIAGNOSIS — I10 Essential (primary) hypertension: Secondary | ICD-10-CM

## 2021-09-23 MED ORDER — KETOCONAZOLE 2 % EX SHAM
1.0000 "application " | MEDICATED_SHAMPOO | CUTANEOUS | 0 refills | Status: AC
  Filled 2021-09-23: qty 120, 25d supply, fill #0

## 2021-09-23 NOTE — Patient Instructions (Addendum)
For your scalp rash we are going to try some ketoconazole shampoo.  Use this twice per week while showering.  You can use the [facility] on the other days.  If it does not improve in the next 4 weeks or if it worsens before that please follow back up with [place]. ? ?Your blood pressure is doing well, continue on your current medicines. ?

## 2021-09-23 NOTE — Progress Notes (Signed)
    SUBJECTIVE:   CHIEF COMPLAINT / HPI:   BP Check: Previously seen on 09/09/2021 and noted to have blood pressure elevated at 140/93, 138/86 on recheck.  He had ran out of his chlorthalidone and so this was refilled at that time with recommendation for him to follow-up in 2 weeks for blood pressure check and lab work.  Today he states he has been take his medicine as prescribed.  Seborrheic dermatitis: Patient states that he has been having some itchiness and dryness of his scalp on and off since 2016.  He normally uses some [employer] which sometimes helps.  He states that it is a bit better today than normal but he gets a lot of itching of his scalp which is generalized.  He request medication to help with this.  PERTINENT  PMH / PSH: Hypertension  OBJECTIVE:   BP 125/75   Pulse 76   Wt 203 lb 6.4 oz (92.3 kg)   SpO2 98%   BMI 29.18 kg/m    General: NAD, pleasant, able to participate in exam HEENT: Hair cut short and clean with some flaking and scaling of skin present around the hair which I would classify as mild.  He has no set areas of alopecia, erythema, or lesions noted. Respiratory: No respiratory distress Neuro: alert, no obvious focal deficits Psych: Normal affect and mood  ASSESSMENT/PLAN:   Hypertension: BP today of 125/75. Current meds include chlorthalidone.  Continue chlorthalidone.  Seborrheic dermatitis: Been present on and off since 2016 with dry itchy scalp.  He has used [employer] which is not currently controlling it.  There is no scalp lesions, areas of alopecia, or other abnormal findings besides some mild flaking of the skin present diffusely on the scalp.  We will give trial of ketoconazole shampoo.  We will have him follow-up in 4 weeks if it does not improve.  Lurline Del, [place]

## 2021-12-15 ENCOUNTER — Other Ambulatory Visit (HOSPITAL_COMMUNITY): Payer: Self-pay

## 2022-03-15 ENCOUNTER — Other Ambulatory Visit (HOSPITAL_COMMUNITY): Payer: Self-pay

## 2022-05-20 IMAGING — CR DG LUMBAR SPINE COMPLETE 4+V
5 series · 5 of 5 positions shown · non-contrast
Comparison: None.

CLINICAL DATA: Low back pain

EXAM:
LUMBAR SPINE - COMPLETE 4+ VIEW

[t l-spine a.p.]
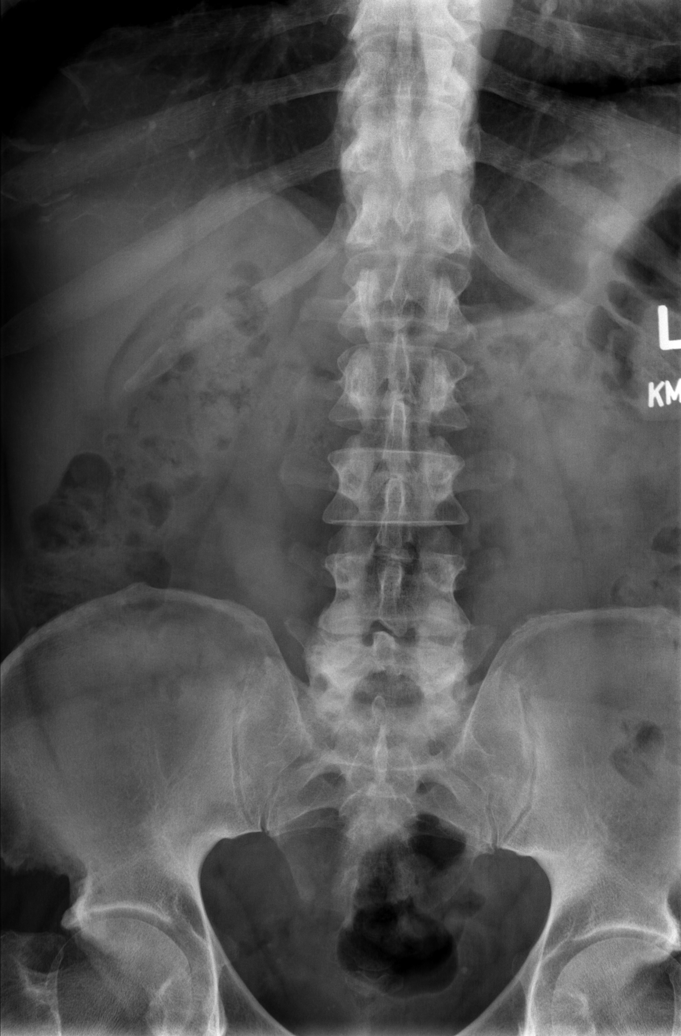

[t l-spine oblique exposure (1 of 2)]
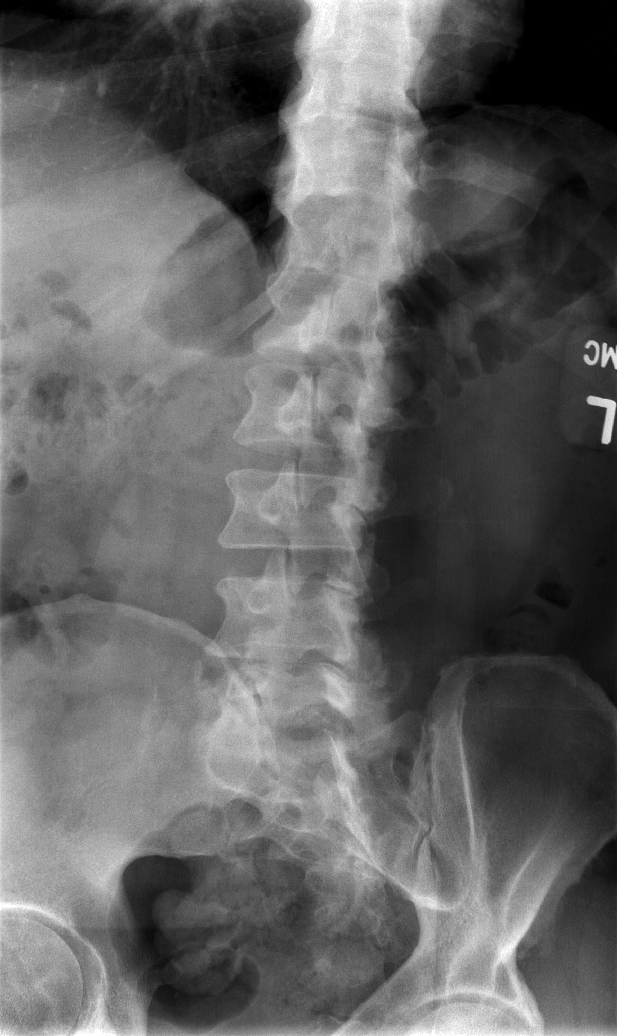

[t l-spine oblique exposure (2 of 2)]
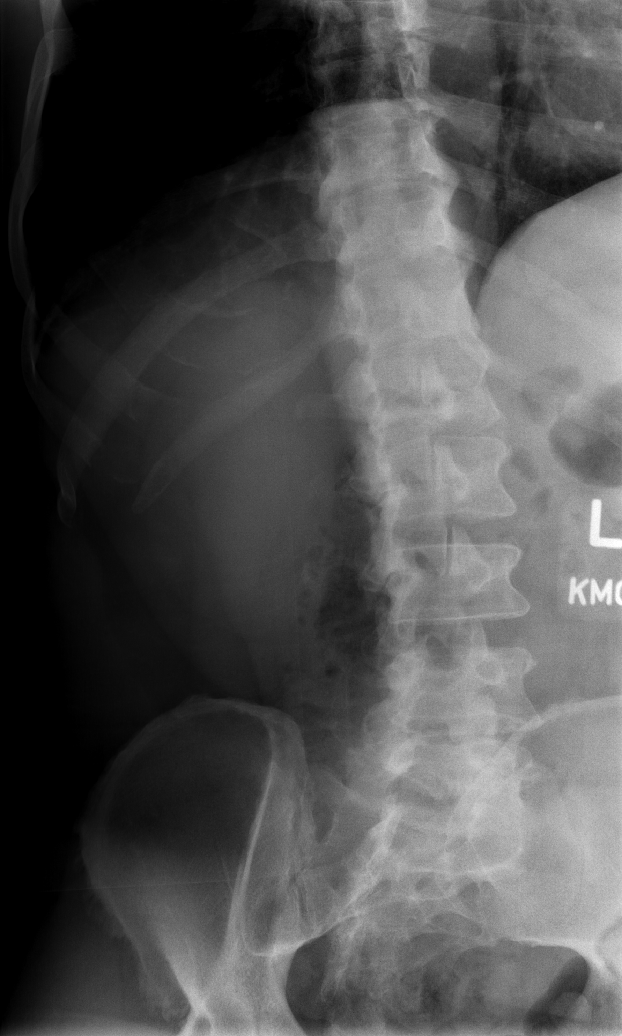

[t l-spine lat]
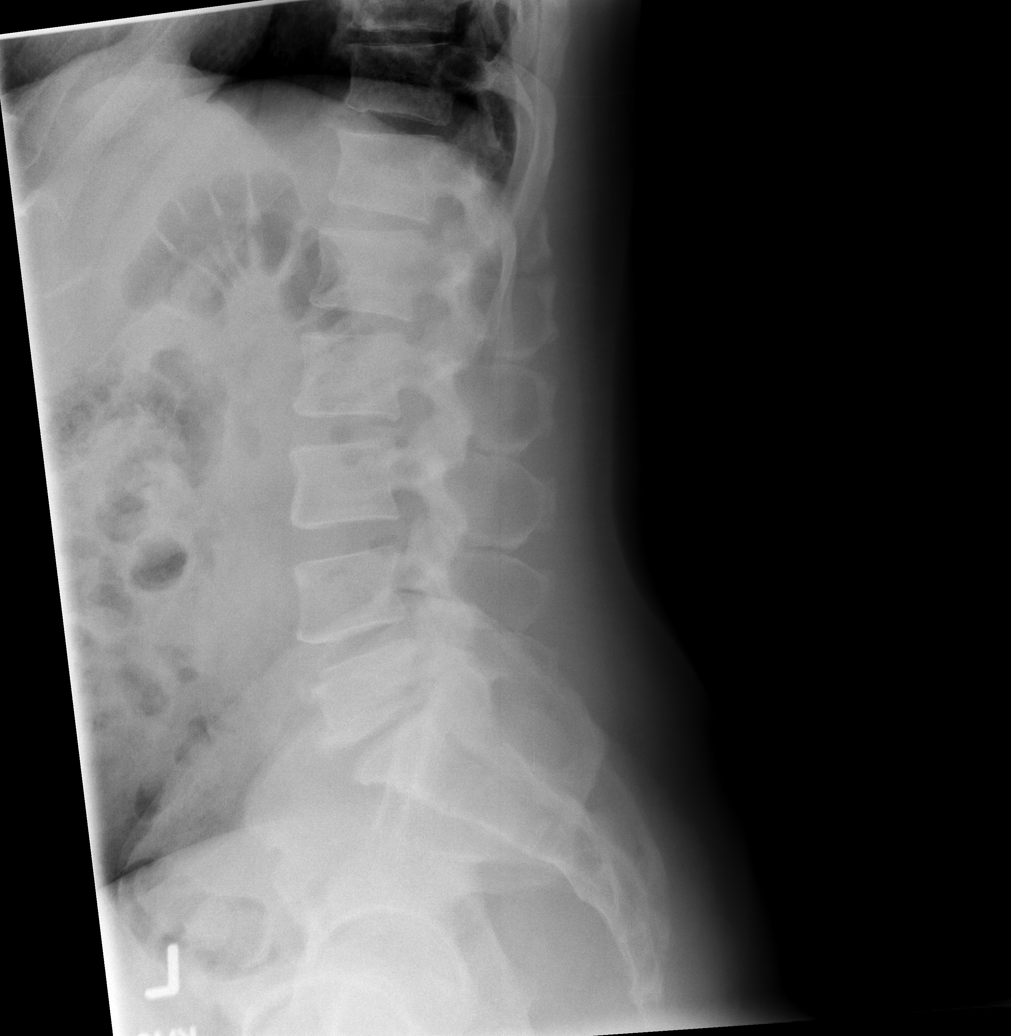

[t l-spine l5-s1 spot]
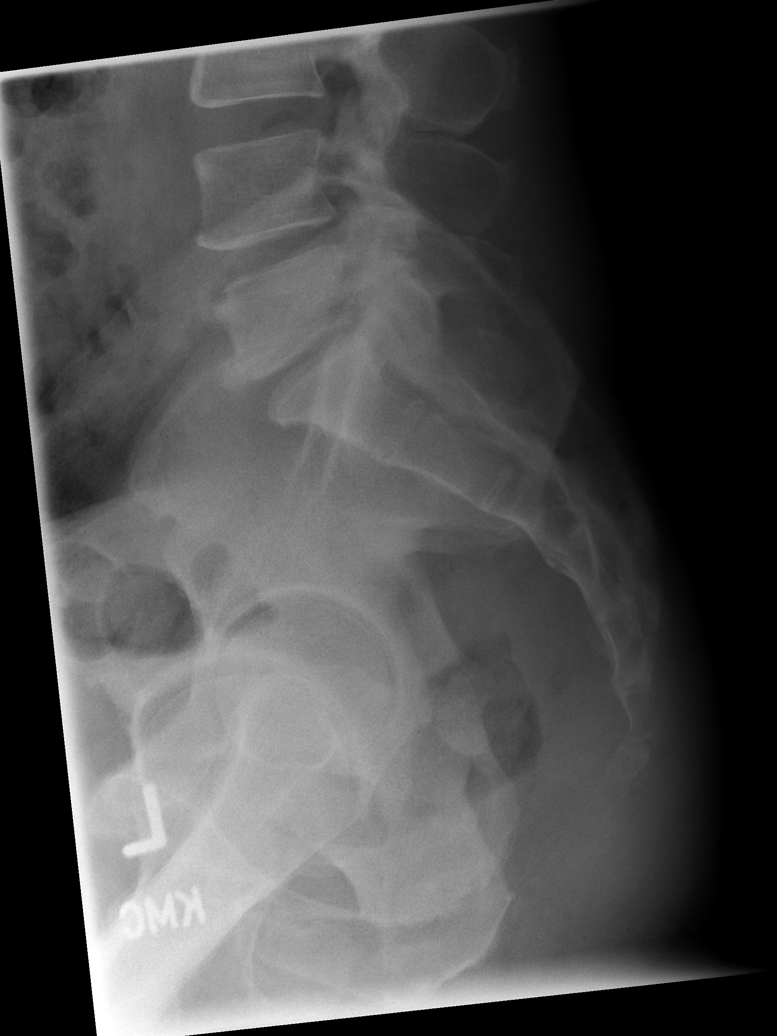

[5 of 5 positions shown; findings below may reference images not displayed]

FINDINGS: Minimal anterior wedging of T11 and T12 with less than 10% loss of
anterior height. No other evidence of fracture. Mild degenerative
disc disease seen at L5-S1. No fracture or traumatic malalignment.
No other acute abnormalities.
IMPRESSION: Minimal anterior wedging of T11 and T12 with less than 10% loss of
anterior height is nonspecific but favored to be nonacute. Mild
degenerative changes at L5-S1.

## 2022-06-16 ENCOUNTER — Other Ambulatory Visit (HOSPITAL_COMMUNITY): Payer: Self-pay

## 2022-09-13 ENCOUNTER — Other Ambulatory Visit (HOSPITAL_COMMUNITY): Payer: Self-pay

## 2022-09-13 ENCOUNTER — Other Ambulatory Visit: Payer: Self-pay | Admitting: Family Medicine

## 2022-09-13 DIAGNOSIS — I10 Essential (primary) hypertension: Secondary | ICD-10-CM

## 2022-09-14 ENCOUNTER — Other Ambulatory Visit (HOSPITAL_COMMUNITY): Payer: Self-pay

## 2022-09-14 MED ORDER — CHLORTHALIDONE 25 MG PO TABS
25.0000 mg | ORAL_TABLET | Freq: Every day | ORAL | 0 refills | Status: DC
  Filled 2022-09-14: qty 30, 30d supply, fill #0

## 2022-09-14 MED ORDER — ATORVASTATIN CALCIUM 20 MG PO TABS
20.0000 mg | ORAL_TABLET | Freq: Every day | ORAL | 0 refills | Status: DC
  Filled 2022-09-14: qty 30, 30d supply, fill #0

## 2022-09-15 ENCOUNTER — Other Ambulatory Visit (HOSPITAL_COMMUNITY): Payer: Self-pay

## 2022-10-09 ENCOUNTER — Ambulatory Visit (HOSPITAL_COMMUNITY)
Admission: RE | Admit: 2022-10-09 | Discharge: 2022-10-09 | Disposition: A | Discharge status: Home / Self Care | Payer: 59 | Source: Ambulatory Visit | Attending: Family Medicine | Admitting: Family Medicine

## 2022-10-09 ENCOUNTER — Ambulatory Visit (INDEPENDENT_AMBULATORY_CARE_PROVIDER_SITE_OTHER): Payer: 59 | Admitting: Student

## 2022-10-09 ENCOUNTER — Other Ambulatory Visit (HOSPITAL_COMMUNITY): Payer: Self-pay

## 2022-10-09 ENCOUNTER — Encounter: Payer: Self-pay | Admitting: Student

## 2022-10-09 VITALS — BP 107/68 | HR 60 | Ht 70.0 in | Wt 198.2 lb

## 2022-10-09 DIAGNOSIS — G8929 Other chronic pain: Secondary | ICD-10-CM

## 2022-10-09 DIAGNOSIS — I499 Cardiac arrhythmia, unspecified: Secondary | ICD-10-CM

## 2022-10-09 DIAGNOSIS — I209 Angina pectoris, unspecified: Secondary | ICD-10-CM | POA: Insufficient documentation

## 2022-10-09 DIAGNOSIS — E785 Hyperlipidemia, unspecified: Secondary | ICD-10-CM | POA: Diagnosis not present

## 2022-10-09 DIAGNOSIS — I10 Essential (primary) hypertension: Secondary | ICD-10-CM | POA: Diagnosis not present

## 2022-10-09 DIAGNOSIS — M545 Low back pain, unspecified: Secondary | ICD-10-CM

## 2022-10-09 LAB — POCT GLYCOSYLATED HEMOGLOBIN (HGB A1C): Hemoglobin A1C: 4.9 % (ref 4.0–5.6)

## 2022-10-09 MED ORDER — CHLORTHALIDONE 25 MG PO TABS
25.0000 mg | ORAL_TABLET | Freq: Every day | ORAL | 0 refills | Status: DC
  Filled 2022-10-09: qty 90, 90d supply, fill #0

## 2022-10-09 MED ORDER — ATORVASTATIN CALCIUM 20 MG PO TABS
20.0000 mg | ORAL_TABLET | Freq: Every day | ORAL | 0 refills | Status: DC
  Filled 2022-10-09: qty 90, 90d supply, fill #0

## 2022-10-09 NOTE — Patient Instructions (Addendum)
It was great to see you! Thank you for allowing me to participate in your care!   I recommend that you always bring your medications to each appointment as this makes it easy to ensure we are on the correct medications and helps [place] not miss when refills are needed.  Our plans for today:  - Your A1c showed normal level! No diabetes or prediabetes - Your EKG showed frequent PVCs which are benign extra beats - I am referring you to cardiology about your intermittent chest pain  We are checking some labs today, I will call you if they are abnormal will send you a MyChart message or a letter if they are normal.  If you do not hear about your labs in the next 2 weeks please let us know.  Take care and seek immediate care sooner if you develop any concerns. Please remember to show up 15 minutes before your scheduled appointment time!  Levin Erp, MD [facility]

## 2022-10-09 NOTE — Assessment & Plan Note (Addendum)
Patient says that he started to have chest pain with increased exertion with his job as well as walking.  Sometimes happens in room intermittently when he is not active.  No swelling, JVD or orthopnea. Will refer to cardiology to consider stress testing/angina eval. Does have multiple PVCs on EKG but no -Referral to Cardiology -CBC

## 2022-10-09 NOTE — Progress Notes (Signed)
    SUBJECTIVE:   Chief compliant/HPI: annual examination  Gregory Benson is a 62 y.o. who presents today for an annual exam.   Intermittent Chest Pain He has noticed that he is intermittently has chest pain.  He works as a Museum/gallery exhibitions officer.  He says that sometimes it is worse when turning.  Sometimes he says that he intermittently has this chest pain when he is just sitting but for the most part it is when he is active walking.  Denies any pain on inclines.  Denies any shortness of breath or swelling.  No orthopnea.  Denies any skipped beats or palpitations.  No chest pain currently.  Hypertension: Patient is a 62 y.o. male who present today for follow up of hypertension.   Patient endorses no problems  Home medications include: chlorthalidone 25 mg daily Patient endorses taking these medications as prescribed. Denies any headache, vision changes, shortness of breath, lower extremity swelling or chest pain   Most recent creatinine trend:  Lab Results  Component Value Date   CREATININE 1.06 07/21/2021   CREATININE 1.12 09/03/2020   CREATININE 0.96 02/27/2019   HLD Started on atorvastatin 20 mg 3/23 due to elevated ASCVD. Adherant to this med. Due for repeat testing today.   Back Pain Lower back pain for many years now.  Has previously gotten lumbar x-ray that showed some degenerative changes.  He denies any sciatica/radiation of pain down his leg.  No leg weakness.  No sensation differences.  No saddle anesthesia.  No bowel or bladder incontinence.  He works as a Museum/gallery exhibitions officer and he does a twisting motion a lot with his drop and that seems to worsen it.  Erectile dysfunction On Viagra however has been out of this for a while.  History tabs reviewed and updated.    OBJECTIVE:   BP 107/68   Pulse 60   Ht 5\' 10"  (1.778 m)   Wt 198 lb 4 oz (89.9 kg)   SpO2 100%   BMI 28.45 kg/m   General: Well appearing, NAD, awake, alert, responsive to questions Head: Normocephalic  atraumatic CV: Regular rate and irregular rhythm no murmurs rubs or gallops Respiratory: Clear to ausculation bilaterally, no wheezes rales or crackles, chest rises symmetrically,  no increased work of breathing Abdomen: Soft, non-tender to palpation Extremities: Moves upper and lower extremities freely, no edema in LE Neuro: No focal deficits Skin: No rashes or lesions visualized     ASSESSMENT/PLAN:   Hypertension Well-controlled on chlorthalidone. -Refill chlorthalidone -CMP  Hyperlipidemia Has been adherent to atorvastatin 20 mg daily. -Refilled atorvastatin -Lipid panel  Chronic bilateral low back pain No red flag symptoms or sciatica symptoms today.  Could consider physical therapy in the future however would like to workup angina prior to referring.  Angina pectoris [facility]) Patient says that he started to have chest pain with increased exertion with his job as well as walking.  Sometimes happens in room intermittently when he is not active.  No swelling, JVD or orthopnea. Will refer to cardiology to consider stress testing/angina eval. Does have multiple PVCs on EKG but no -Referral to Cardiology -CBC   Annual Examination  See AVS for age appropriate recommendations.  Blood pressure value is at goal, discussed.  Considered the following screening exams based upon USPSTF recommendations: Diabetes screening: ordered + normal at 4.9 Screening for elevated cholesterol: ordered  Follow up in 1 year or sooner if indicated.   Levin Erp, MD [facility]

## 2022-10-09 NOTE — Assessment & Plan Note (Signed)
Has been adherent to atorvastatin 20 mg daily. -Refilled atorvastatin -Lipid panel

## 2022-10-09 NOTE — Assessment & Plan Note (Addendum)
Well-controlled on chlorthalidone. -Refill chlorthalidone -CMP

## 2022-10-09 NOTE — Assessment & Plan Note (Signed)
No red flag symptoms or sciatica symptoms today.  Could consider physical therapy in the future however would like to workup angina prior to referring.

## 2022-10-10 LAB — COMPREHENSIVE METABOLIC PANEL
ALT: 11 IU/L (ref 0–44)
AST: 22 IU/L (ref 0–40)
Albumin/Globulin Ratio: 1.8 (ref 1.2–2.2)
Albumin: 4.4 g/dL (ref 3.8–4.9)
Alkaline Phosphatase: 51 IU/L (ref 44–121)
BUN/Creatinine Ratio: 19 (ref 10–24)
BUN: 25 mg/dL (ref 8–27)
Bilirubin Total: 0.7 mg/dL (ref 0.0–1.2)
CO2: 26 mmol/L (ref 20–29)
Calcium: 10.1 mg/dL (ref 8.6–10.2)
Chloride: 101 mmol/L (ref 96–106)
Creatinine, Ser: 1.32 mg/dL — ABNORMAL HIGH (ref 0.76–1.27)
Globulin, Total: 2.4 g/dL (ref 1.5–4.5)
Glucose: 84 mg/dL (ref 70–99)
Potassium: 3.3 mmol/L — ABNORMAL LOW (ref 3.5–5.2)
Sodium: 142 mmol/L (ref 134–144)
Total Protein: 6.8 g/dL (ref 6.0–8.5)
eGFR: 62 mL/min/{1.73_m2} (ref >59)

## 2022-10-10 LAB — CBC
Hematocrit: 44.4 % (ref 37.5–51.0)
Hemoglobin: 14.3 g/dL (ref 13.0–17.7)
MCH: 28.9 pg (ref 26.6–33.0)
MCHC: 32.2 g/dL (ref 31.5–35.7)
MCV: 90 fL (ref 79–97)
Platelets: 137 10*3/uL — ABNORMAL LOW (ref 150–450)
RBC: 4.94 x10E6/uL (ref 4.14–5.80)
RDW: 12.4 % (ref 11.6–15.4)
WBC: 5 10*3/uL (ref 3.4–10.8)

## 2022-10-10 LAB — LIPID PANEL
Chol/HDL Ratio: 2.3 ratio (ref 0.0–5.0)
Cholesterol, Total: 119 mg/dL (ref 100–199)
HDL: 51 mg/dL (ref >39)
LDL Chol Calc (NIH): 54 mg/dL (ref 0–99)
Triglycerides: 66 mg/dL (ref 0–149)
VLDL Cholesterol Cal: 14 mg/dL (ref 5–40)

## 2022-10-11 ENCOUNTER — Encounter: Payer: Self-pay | Admitting: Student

## 2022-10-11 ENCOUNTER — Telehealth: Payer: Self-pay | Admitting: Student

## 2022-10-11 NOTE — Telephone Encounter (Signed)
Called patient 3 times and left voicemail to please call our office back at [phone].  If patient calls back-please let him know that he needs a repeat BMP and visit next week to check his kidney function as it is mildly elevated from his baseline and he does have a slightly low potassium which she will need to eat potassium rich foods for including spinach, beans, banana.

## 2022-10-17 ENCOUNTER — Encounter: Payer: Self-pay | Admitting: Student

## 2022-10-18 ENCOUNTER — Other Ambulatory Visit: Payer: Self-pay | Admitting: Student

## 2022-10-18 DIAGNOSIS — I10 Essential (primary) hypertension: Secondary | ICD-10-CM

## 2022-10-25 ENCOUNTER — Other Ambulatory Visit: Payer: 59

## 2022-10-25 DIAGNOSIS — I10 Essential (primary) hypertension: Secondary | ICD-10-CM

## 2022-10-26 LAB — BASIC METABOLIC PANEL
BUN/Creatinine Ratio: 15 (ref 10–24)
BUN: 19 mg/dL (ref 8–27)
CO2: 25 mmol/L (ref 20–29)
Calcium: 10.3 mg/dL — ABNORMAL HIGH (ref 8.6–10.2)
Chloride: 100 mmol/L (ref 96–106)
Creatinine, Ser: 1.25 mg/dL (ref 0.76–1.27)
Glucose: 96 mg/dL (ref 70–99)
Potassium: 3.5 mmol/L (ref 3.5–5.2)
Sodium: 142 mmol/L (ref 134–144)
eGFR: 66 mL/min/{1.73_m2} (ref >59)

## 2022-10-28 ENCOUNTER — Encounter: Payer: Self-pay | Admitting: Student

## 2022-10-30 ENCOUNTER — Encounter: Payer: Self-pay | Admitting: Student

## 2022-12-08 ENCOUNTER — Ambulatory Visit: Payer: 59 | Admitting: Cardiology

## 2023-01-18 ENCOUNTER — Other Ambulatory Visit: Payer: Self-pay | Admitting: Student

## 2023-01-18 ENCOUNTER — Other Ambulatory Visit (HOSPITAL_COMMUNITY): Payer: Self-pay

## 2023-01-18 DIAGNOSIS — I10 Essential (primary) hypertension: Secondary | ICD-10-CM

## 2023-01-18 DIAGNOSIS — E785 Hyperlipidemia, unspecified: Secondary | ICD-10-CM

## 2023-01-18 MED ORDER — CHLORTHALIDONE 25 MG PO TABS
25.0000 mg | ORAL_TABLET | Freq: Every day | ORAL | 0 refills | Status: DC
  Filled 2023-01-18: qty 90, 90d supply, fill #0

## 2023-01-18 MED ORDER — ATORVASTATIN CALCIUM 20 MG PO TABS
20.0000 mg | ORAL_TABLET | Freq: Every day | ORAL | 0 refills | Status: DC
  Filled 2023-01-18: qty 90, 90d supply, fill #0

## 2023-01-31 ENCOUNTER — Ambulatory Visit: Payer: 59 | Attending: Cardiology

## 2023-01-31 ENCOUNTER — Ambulatory Visit: Payer: 59 | Attending: Cardiology | Admitting: Cardiology

## 2023-01-31 VITALS — BP 124/80 | HR 44 | Ht 69.0 in | Wt 193.6 lb

## 2023-01-31 DIAGNOSIS — R0609 Other forms of dyspnea: Secondary | ICD-10-CM

## 2023-01-31 DIAGNOSIS — I493 Ventricular premature depolarization: Secondary | ICD-10-CM

## 2023-01-31 DIAGNOSIS — I10 Essential (primary) hypertension: Secondary | ICD-10-CM

## 2023-01-31 NOTE — Patient Instructions (Addendum)
Medication Instructions:  Your physician recommends that you continue on your current medications as directed. Please refer to the Current Medication list given to you today.  *If you need a refill on your cardiac medications before your next appointment, please call your pharmacy*   Lab Work: None   Testing/Procedures: Your physician has requested that you have an echocardiogram. Echocardiography is a painless test that uses sound waves to create images of your heart. It provides your doctor with information about the size and shape of your heart and how well your heart's chambers and valves are working. This procedure takes approximately one hour. There are no restrictions for this procedure. Please do NOT wear cologne, perfume, aftershave, or lotions (deodorant is allowed). Please arrive 15 minutes prior to your appointment time.  ZIO XT- Long Term Monitor Instructions  Your physician has requested you wear a ZIO patch monitor for 7 days.  This is a single patch monitor. [employer] supplies one patch monitor per enrollment. Additional stickers are not available. Please do not apply patch if you will be having a Nuclear Stress Test,  Echocardiogram, Cardiac CT, MRI, or Chest Xray during the period you would be wearing the  monitor. The patch cannot be worn during these tests. You cannot remove and re-apply the  ZIO XT patch monitor.  Your ZIO patch monitor will be mailed 3 day USPS to your address on file. It may take 3-5 days  to receive your monitor after you have been enrolled.  Once you have received your monitor, please review the enclosed instructions. Your monitor  has already been registered assigning a specific monitor serial # to you.  Billing and Patient Assistance Program Information  We have supplied [employer] with any of your insurance information on file for billing purposes. [employer] offers a sliding scale Patient Assistance Program for patients that do not have  insurance,  or whose insurance does not completely cover the cost of the ZIO monitor.  You must apply for the Patient Assistance Program to qualify for this discounted rate.  To apply, please call [employer] at [phone], select option 4, select option 2, ask to apply for  Patient Assistance Program. Meredeth Ide will ask your household income, and how many people  are in your household. They will quote your out-of-pocket cost based on that information.  [employer] will also be able to set up a 8-month, interest-free payment plan if needed.  Applying the monitor   Shave hair from upper left chest.  Hold abrader disc by orange tab. Rub abrader in 40 strokes over the upper left chest as  indicated in your monitor instructions.  Clean area with 4 enclosed alcohol pads. Let dry.  Apply patch as indicated in monitor instructions. Patch will be placed under collarbone on left  side of chest with arrow pointing upward.  Rub patch adhesive wings for 2 minutes. Remove white label marked "1". Remove the white  label marked "2". Rub patch adhesive wings for 2 additional minutes.  While looking in a mirror, press and release button in center of patch. A small green light will  flash 3-4 times. This will be your only indicator that the monitor has been turned on.  Do not shower for the first 24 hours. You may shower after the first 24 hours.  Press the button if you feel a symptom. You will hear a small click. Record Date, Time and  Symptom in the Patient Logbook.  When you are ready to remove the patch, follow  instructions on the last 2 pages of Patient  Logbook. Stick patch monitor onto the last page of Patient Logbook.  Place Patient Logbook in the blue and white box. Use locking tab on box and tape box closed  securely. The blue and white box has prepaid postage on it. Please place it in the mailbox as  soon as possible. Your physician should have your test results approximately 7 days after the  monitor has been  mailed back to [facility].  Call [facility] Customer Care at [phone] if you have questions regarding  your ZIO XT patch monitor. Call them immediately if you see an orange light blinking on your  monitor.  If your monitor falls off in less than 4 days, contact our Monitor department at [phone].  If your monitor becomes loose or falls off after 4 days call [employer] at [phone] for  suggestions on securing your monitor   Follow-Up: At [facility], you and your health needs are our priority.  As part of our continuing mission to provide you with exceptional heart care, we have created designated Provider Care Teams.  These Care Teams include your primary Cardiologist (physician) and Advanced Practice Providers (APPs -  Physician Assistants and Nurse Practitioners) who all work together to provide you with the care you need, when you need it.  We recommend signing up for the patient portal called "MyChart".  Sign up information is provided on this After Visit Summary.  MyChart is used to connect with patients for Virtual Visits (Telemedicine).  Patients are able to view lab/test results, encounter notes, upcoming appointments, etc.  Non-urgent messages can be sent to your provider as well.   To learn more about what you can do with MyChart, go to [email or web address].    Your next appointment:   16 week(s)  Provider:   Thomasene Ripple, DO

## 2023-01-31 NOTE — Progress Notes (Unsigned)
Enrolled for [employer] to mail a ZIO XT long term holter monitor to the patients address on file.  

## 2023-01-31 NOTE — Progress Notes (Signed)
Cardiology Office Note:    Date:  01/31/2023   ID:  Gregory Benson, DOB 07/02/1960, MRN [id number]  PCP:  Levin Erp, MD  Cardiologist:  Thomasene Ripple, DO  Electrophysiologist:  None   Referring MD: Levin Erp, MD   " I am having palpitations"   History of Present Illness:    Gregory Benson is a 62 y.o. male with a hx of hypertension, family history of coronary arrest in his brother, presents to be evaluated for significant heart fluttering and noted PVCs on his EKG.  He also admits that recently he has been experiencing shortness of breath on exertion.  He is concerned.  He wants to get checked out.     Past Medical History:  Diagnosis Date   Chest pain    Chronic headaches    Hypertension     Past Surgical History:  Procedure Laterality Date   NO PAST SURGERIES      Current Medications: Current Meds  Medication Sig   atorvastatin (LIPITOR) 20 MG tablet Take 1 tablet (20 mg total) by mouth daily.   chlorthalidone (HYGROTON) 25 MG tablet Take 1 tablet (25 mg total) by mouth daily.     Allergies:   Patient has no known allergies.   Social History   Socioeconomic History   Marital status: Legally Separated    Spouse name: Not on file   Number of children: Not on file   Years of education: Not on file   Highest education level: Not on file  Occupational History   Occupation: Estate agent  Tobacco Use   Smoking status: Never   Smokeless tobacco: Never  Vaping Use   Vaping status: Never Used  Substance and Sexual Activity   Alcohol use: Yes    Comment: occ wine   Drug use: No   Sexual activity: Not Currently  Other Topics Concern   Not on file  Social History Narrative   Not on file   Social Determinants of Health   Financial Resource Strain: Not on file  Food Insecurity: Not on file  Transportation Needs: Not on file  Physical Activity: Not on file  Stress: Not on file  Social Connections: Not on file     Family History: The patient's  family history includes Heart disease in his brother; Stroke in his father and paternal grandfather. There is no history of Colon cancer, Colon polyps, Esophageal cancer, Rectal cancer, or Stomach cancer.  ROS:   Review of Systems  Constitution: Negative for decreased appetite, fever and weight gain.  HENT: Negative for congestion, ear discharge, hoarse voice and sore throat.   Eyes: Negative for discharge, redness, vision loss in right eye and visual halos.  Cardiovascular: Negative for chest pain, dyspnea on exertion, leg swelling, orthopnea and palpitations.  Respiratory: Negative for cough, hemoptysis, shortness of breath and snoring.   Endocrine: Negative for heat intolerance and polyphagia.  Hematologic/Lymphatic: Negative for bleeding problem. Does not bruise/bleed easily.  Skin: Negative for flushing, nail changes, rash and suspicious lesions.  Musculoskeletal: Negative for arthritis, joint pain, muscle cramps, myalgias, neck pain and stiffness.  Gastrointestinal: Negative for abdominal pain, bowel incontinence, diarrhea and excessive appetite.  Genitourinary: Negative for decreased libido, genital sores and incomplete emptying.  Neurological: Negative for brief paralysis, focal weakness, headaches and loss of balance.  Psychiatric/Behavioral: Negative for altered mental status, depression and suicidal ideas.  Allergic/Immunologic: Negative for HIV exposure and persistent infections.    EKGs/Labs/Other Studies Reviewed:    The following studies were reviewed  today:   EKG:  The ekg ordered today demonstrates sinus rhythm with frequent PVCs.  Recent Labs: 10/09/2022: ALT 11; Hemoglobin 14.3; Platelets 137 10/25/2022: BUN 19; Creatinine, Ser 1.25; Potassium 3.5; Sodium 142  Recent Lipid Panel    Component Value Date/Time   CHOL 119 10/09/2022 1720   TRIG 66 10/09/2022 1720   HDL 51 10/09/2022 1720   CHOLHDL 2.3 10/09/2022 1720   CHOLHDL 3.3 02/27/2019 1153   VLDL 18  02/27/2019 1153   LDLCALC 54 10/09/2022 1720    Physical Exam:    VS:  BP 124/80 (BP Location: Left Arm, Patient Position: Sitting, Cuff Size: Normal)   Pulse (!) 44   Ht 5\' 9"  (1.753 m)   Wt 193 lb 9.6 oz (87.8 kg)   SpO2 96%   BMI 28.59 kg/m     Wt Readings from Last 3 Encounters:  01/31/23 193 lb 9.6 oz (87.8 kg)  10/09/22 198 lb 4 oz (89.9 kg)  09/23/21 203 lb 6.4 oz (92.3 kg)     GEN: Well nourished, well developed in no acute distress HEENT: Normal NECK: No JVD; No carotid bruits LYMPHATICS: No lymphadenopathy CARDIAC: S1S2 noted,RRR, no murmurs, rubs, gallops RESPIRATORY:  Clear to auscultation without rales, wheezing or rhonchi  ABDOMEN: Soft, non-tender, non-distended, +bowel sounds, no guarding. EXTREMITIES: No edema, No cyanosis, no clubbing MUSCULOSKELETAL:  No deformity  SKIN: Warm and dry NEUROLOGIC:  Alert and oriented x 3, non-focal PSYCHIATRIC:  Normal affect, good insight  ASSESSMENT:    1. Hypertension, unspecified type   2. Frequent PVCs   3. DOE (dyspnea on exertion)    PLAN:    His EKG showed frequent PVC.  Will place a monitor to understand the percentage of total burden.  Based on information from his heart monitor as well as the echocardiogram will plan to treat the patient.  Blood pressure is acceptable, continue with current antihypertensive regimen.  The patient is in agreement with the above plan. The patient left the office in stable condition.  The patient will follow up in 12 weeks or sooner if needed.   Medication Adjustments/Labs and Tests Ordered: Current medicines are reviewed at length with the patient today.  Concerns regarding medicines are outlined above.  Orders Placed This Encounter  Procedures   LONG TERM MONITOR (3-14 DAYS)   EKG 12-Lead   ECHOCARDIOGRAM COMPLETE   No orders of the defined types were placed in this encounter.   Patient Instructions  Medication Instructions:  Your physician recommends that you  continue on your current medications as directed. Please refer to the Current Medication list given to you today.  *If you need a refill on your cardiac medications before your next appointment, please call your pharmacy*   Lab Work: None   Testing/Procedures: Your physician has requested that you have an echocardiogram. Echocardiography is a painless test that uses sound waves to create images of your heart. It provides your doctor with information about the size and shape of your heart and how well your heart's chambers and valves are working. This procedure takes approximately one hour. There are no restrictions for this procedure. Please do NOT wear cologne, perfume, aftershave, or lotions (deodorant is allowed). Please arrive 15 minutes prior to your appointment time.  ZIO XT- Long Term Monitor Instructions  Your physician has requested you wear a ZIO patch monitor for 7 days.  This is a single patch monitor. [employer] supplies one patch monitor per enrollment. Additional stickers are not available. Please do  not apply patch if you will be having a Nuclear Stress Test,  Echocardiogram, Cardiac CT, MRI, or Chest Xray during the period you would be wearing the  monitor. The patch cannot be worn during these tests. You cannot remove and re-apply the  ZIO XT patch monitor.  Your ZIO patch monitor will be mailed 3 day USPS to your address on file. It may take 3-5 days  to receive your monitor after you have been enrolled.  Once you have received your monitor, please review the enclosed instructions. Your monitor  has already been registered assigning a specific monitor serial # to you.  Billing and Patient Assistance Program Information  We have supplied [employer] with any of your insurance information on file for billing purposes. [employer] offers a sliding scale Patient Assistance Program for patients that do not have  insurance, or whose insurance does not completely cover the cost of the  ZIO monitor.  You must apply for the Patient Assistance Program to qualify for this discounted rate.  To apply, please call [employer] at [phone], select option 4, select option 2, ask to apply for  Patient Assistance Program. Meredeth Ide will ask your household income, and how many people  are in your household. They will quote your out-of-pocket cost based on that information.  [employer] will also be able to set up a 10-month, interest-free payment plan if needed.  Applying the monitor   Shave hair from upper left chest.  Hold abrader disc by orange tab. Rub abrader in 40 strokes over the upper left chest as  indicated in your monitor instructions.  Clean area with 4 enclosed alcohol pads. Let dry.  Apply patch as indicated in monitor instructions. Patch will be placed under collarbone on left  side of chest with arrow pointing upward.  Rub patch adhesive wings for 2 minutes. Remove white label marked "1". Remove the white  label marked "2". Rub patch adhesive wings for 2 additional minutes.  While looking in a mirror, press and release button in center of patch. A small green light will  flash 3-4 times. This will be your only indicator that the monitor has been turned on.  Do not shower for the first 24 hours. You may shower after the first 24 hours.  Press the button if you feel a symptom. You will hear a small click. Record Date, Time and  Symptom in the Patient Logbook.  When you are ready to remove the patch, follow instructions on the last 2 pages of Patient  Logbook. Stick patch monitor onto the last page of Patient Logbook.  Place Patient Logbook in the blue and white box. Use locking tab on box and tape box closed  securely. The blue and white box has prepaid postage on it. Please place it in the mailbox as  soon as possible. Your physician should have your test results approximately 7 days after the  monitor has been mailed back to [facility].  Call [facility] Customer  Care at [phone] if you have questions regarding  your ZIO XT patch monitor. Call them immediately if you see an orange light blinking on your  monitor.  If your monitor falls off in less than 4 days, contact our Monitor department at [phone].  If your monitor becomes loose or falls off after 4 days call [employer] at [phone] for  suggestions on securing your monitor   Follow-Up: At [facility], you and your health needs are our priority.  As part of our  continuing mission to provide you with exceptional heart care, we have created designated Provider Care Teams.  These Care Teams include your primary Cardiologist (physician) and Advanced Practice Providers (APPs -  Physician Assistants and Nurse Practitioners) who all work together to provide you with the care you need, when you need it.  We recommend signing up for the patient portal called "MyChart".  Sign up information is provided on this After Visit Summary.  MyChart is used to connect with patients for Virtual Visits (Telemedicine).  Patients are able to view lab/test results, encounter notes, upcoming appointments, etc.  Non-urgent messages can be sent to your provider as well.   To learn more about what you can do with MyChart, go to [email or web address].    Your next appointment:   16 week(s)  Provider:   Thomasene Ripple, DO    Adopting a Healthy Lifestyle.  Know what a healthy weight is for you (roughly BMI <25) and aim to maintain this   Aim for 7+ servings of fruits and vegetables daily   65-80+ fluid ounces of water or unsweet tea for healthy kidneys   Limit to max 1 drink of alcohol per day; avoid smoking/tobacco   Limit animal fats in diet for cholesterol and heart health - choose grass fed whenever available   Avoid highly processed foods, and foods high in saturated/trans fats   Aim for low stress - take time to unwind and care for your mental health   Aim for 150 min of moderate intensity  exercise weekly for heart health, and weights twice weekly for bone health   Aim for 7-9 hours of sleep daily   When it comes to diets, agreement about the perfect plan isnt easy to find, even among the experts. Experts at the [facility] [employer] developed an idea known as the Healthy Eating Plate. Just imagine a plate divided into logical, healthy portions.   The emphasis is on diet quality:   Load up on vegetables and fruits - one-half of your plate: Aim for color and variety, and remember that potatoes dont count.   Go for whole grains - one-quarter of your plate: Whole wheat, barley, wheat berries, quinoa, oats, brown rice, and foods made with them. If you want pasta, go with whole wheat pasta.   Protein power - one-quarter of your plate: Fish, chicken, beans, and nuts are all healthy, versatile protein sources. Limit red meat.   The diet, however, does go beyond the plate, offering a few other suggestions.   Use healthy plant oils, such as olive, canola, soy, corn, sunflower and peanut. Check the labels, and avoid partially hydrogenated oil, which have unhealthy trans fats.   If youre thirsty, drink water. Coffee and tea are good in moderation, but skip sugary drinks and limit milk and dairy products to one or two daily servings.   The type of carbohydrate in the diet is more important than the amount. Some sources of carbohydrates, such as vegetables, fruits, whole grains, and beans-are healthier than others.   Finally, stay active  Signed, Thomasene Ripple, DO  01/31/2023 11:35 AM    [facility]

## 2023-02-09 DIAGNOSIS — I493 Ventricular premature depolarization: Secondary | ICD-10-CM

## 2023-02-19 ENCOUNTER — Ambulatory Visit (HOSPITAL_COMMUNITY): Payer: 59 | Attending: Cardiology

## 2023-02-19 DIAGNOSIS — R0609 Other forms of dyspnea: Secondary | ICD-10-CM | POA: Diagnosis present

## 2023-02-19 LAB — ECHOCARDIOGRAM COMPLETE
Area-P 1/2: 2.36 cm2
S' Lateral: 3.9 cm

## 2023-03-01 ENCOUNTER — Telehealth: Payer: Self-pay

## 2023-03-01 NOTE — Telephone Encounter (Signed)
Called pt. No answer, left message for him to return the call to get a sooner appt.

## 2023-03-01 NOTE — Telephone Encounter (Signed)
-----   Message from Gregory Benson sent at 02/24/2023  4:27 PM EDT ----- Please bring him in sooner than December to discuss his monitor result.  He will be better served has in person visit.

## 2023-04-03 ENCOUNTER — Encounter: Payer: Self-pay | Admitting: Cardiology

## 2023-04-03 ENCOUNTER — Other Ambulatory Visit (HOSPITAL_COMMUNITY): Payer: Self-pay

## 2023-04-03 ENCOUNTER — Ambulatory Visit: Payer: 59 | Attending: Cardiology | Admitting: Cardiology

## 2023-04-03 VITALS — BP 118/80 | HR 63 | Ht 69.0 in | Wt 199.8 lb

## 2023-04-03 DIAGNOSIS — I493 Ventricular premature depolarization: Secondary | ICD-10-CM | POA: Diagnosis not present

## 2023-04-03 DIAGNOSIS — E782 Mixed hyperlipidemia: Secondary | ICD-10-CM

## 2023-04-03 DIAGNOSIS — I10 Essential (primary) hypertension: Secondary | ICD-10-CM | POA: Diagnosis not present

## 2023-04-03 MED ORDER — DILTIAZEM HCL ER COATED BEADS 120 MG PO CP24
120.0000 mg | ORAL_CAPSULE | Freq: Every day | ORAL | 3 refills | Status: DC
  Filled 2023-04-03: qty 90, 90d supply, fill #0

## 2023-04-03 NOTE — Patient Instructions (Signed)
Medication Instructions:  START CARDIZEM 120 MG DAILY.    Lab Work: NONE    Testing/Procedures: NONE   Follow-Up: At [facility], you and your health needs are our priority.  As part of our continuing mission to provide you with exceptional heart care, we have created designated Provider Care Teams.  These Care Teams include your primary Cardiologist (physician) and Advanced Practice Providers (APPs -  Physician Assistants and Nurse Practitioners) who all work together to provide you with the care you need, when you need it.  We recommend signing up for the patient portal called "MyChart".  Sign up information is provided on this After Visit Summary.  MyChart is used to connect with patients for Virtual Visits (Telemedicine).  Patients are able to view lab/test results, encounter notes, upcoming appointments, etc.  Non-urgent messages can be sent to your provider as well.   To learn more about what you can do with MyChart, go to [email or web address].    Your next appointment:   6 month(s)  Provider:   Thomasene Ripple, DO    Other Instructions Mediterranean Diet A Mediterranean diet is based on the traditions of countries on the [place] Sea. It focuses on eating more: Fruits and vegetables. Whole grains, beans, nuts, and seeds. Heart-healthy fats. These are fats that are good for your heart. It involves eating less: Dairy. Meat and eggs. Processed foods with added sugar, salt, and fat. This type of diet can help prevent certain conditions. It can also improve outcomes if you have a long-term (chronic) disease, such as kidney or heart disease. What are tips for following this plan? Reading food labels Check packaged foods for: The serving size. For foods such as rice and pasta, the serving size is the amount of cooked product, not dry. The total fat. Avoid foods with saturated fat or trans fat. Added sugars, such as corn syrup. Shopping  Try to have a  balanced diet. Buy a variety of foods, such as: Fresh fruits and vegetables. You may be able to get these from local farmers markets. You can also buy them frozen. Grains, beans, nuts, and seeds. Some of these can be bought in bulk. Fresh seafood. Poultry and eggs. Low-fat dairy products. Buy whole ingredients instead of foods that have already been packaged. If you can't get fresh seafood, buy precooked frozen shrimp or canned fish, such as tuna, salmon, or sardines. Stock your pantry so you always have certain foods on hand, such as olive oil, canned tuna, canned tomatoes, rice, pasta, and beans. Cooking Cook foods with extra-virgin olive oil instead of using butter or other vegetable oils. Have meat as a side dish. Have vegetables or grains as your main dish. This means having meat in small portions or adding small amounts of meat to foods like pasta or stew. Use beans or vegetables instead of meat in common dishes like chili or lasagna. Try out different cooking methods. Try roasting, broiling, steaming, and sauting vegetables. Add frozen vegetables to soups, stews, pasta, or rice. Add nuts or seeds for added healthy fats and plant protein at each meal. You can add these to yogurt, salads, or vegetable dishes. Marinate fish or vegetables using olive oil, lemon juice, garlic, and fresh herbs. Meal planning Plan to eat a vegetarian meal one day each week. Try to work up to two vegetarian meals, if possible. Eat seafood two or more times a week. Have healthy snacks on hand. These may include: Vegetable sticks with hummus. Greek yogurt. Fruit and  nut trail mix. Eat balanced meals. These should include: Fruit: 2-3 servings a day. Vegetables: 4-5 servings a day. Low-fat dairy: 2 servings a day. Fish, poultry, or lean meat: 1 serving a day. Beans and legumes: 2 or more servings a week. Nuts and seeds: 1-2 servings a day. Whole grains: 6-8 servings a day. Extra-virgin olive oil: 3-4  servings a day. Limit red meat and sweets to just a few servings a month. Lifestyle  Try to cook and eat meals with your family. Drink enough fluid to keep your pee (urine) pale yellow. Be active every day. This includes: Aerobic exercise, which is exercise that causes your heart to beat faster. Examples include running and swimming. Leisure activities like gardening, walking, or housework. Get 7-8 hours of sleep each night. Drink red wine if your provider says you can. A glass of wine is 5 oz (150 mL). You may be allowed to have: Up to 1 glass a day if you're male and not pregnant. Up to 2 glasses a day if you're male. What foods should I eat? Fruits Apples. Apricots. Avocado. Berries. Bananas. Cherries. Dates. Figs. Grapes. Lemons. Melon. Oranges. Peaches. Plums. Pomegranate. Vegetables Artichokes. Beets. Broccoli. Cabbage. Carrots. Eggplant. Green beans. Chard. Kale. Spinach. Onions. Leeks. Peas. Squash. Tomatoes. Peppers. Radishes. Grains Whole-grain pasta. Brown rice. Bulgur wheat. Polenta. Couscous. Whole-wheat bread. Orpah Cobb. Meats and other proteins Beans. Almonds. Sunflower seeds. Pine nuts. Peanuts. Cod. Salmon. Scallops. Shrimp. Tuna. Tilapia. Clams. Oysters. Eggs. Chicken or [place] without skin. Dairy Low-fat milk. Cheese. Greek yogurt. Fats and oils Extra-virgin olive oil. Avocado oil. Grapeseed oil. Beverages Water. Red wine. Herbal tea. Sweets and desserts Greek yogurt with honey. Baked apples. Poached pears. Trail mix. Seasonings and condiments Basil. Cilantro. Coriander. Cumin. Mint. Parsley. Sage. Rosemary. Tarragon. Garlic. Oregano. Thyme. Pepper. Balsamic vinegar. Tahini. Hummus. Tomato sauce. Olives. Mushrooms. The items listed above may not be all the foods and drinks you can have. Talk to a dietitian to learn more. What foods should I limit? This is a list of foods that should be eaten rarely. Fruits Fruit canned in syrup. Vegetables Deep-fried  potatoes, like [place] fries. Grains Packaged pasta or rice dishes. Cereal with added sugar. Snacks with added sugar. Meats and other proteins Beef. Pork. Lamb. Chicken or [place] with skin. Hot dogs. Tomasa Blase. Dairy Ice cream. Sour cream. Whole milk. Fats and oils Butter. Canola oil. Vegetable oil. Beef fat (tallow). Lard. Beverages Juice. Sugar-sweetened soft drinks. Beer. Liquor and spirits. Sweets and desserts Cookies. Cakes. Pies. Candy. Seasonings and condiments Mayonnaise. Pre-made sauces and marinades. The items listed above may not be all the foods and drinks you should limit. Talk to a dietitian to learn more. Where to find more information American Heart Association (AHA): heart.org This information is not intended to replace advice given to you by your health care provider. Make sure you discuss any questions you have with your health care provider. Document Revised: 09/10/2022 Document Reviewed: 09/10/2022 Elsevier Patient Education  2024 [employer].

## 2023-04-03 NOTE — Progress Notes (Signed)
Cardiology Office Note:    Date:  04/03/2023   ID:  Gregory Benson, DOB 06/13/1960, MRN [id number]  PCP:  Levin Erp, MD  Cardiologist:  Thomasene Ripple, DO  Electrophysiologist:  None   Referring MD: Levin Erp, MD    " I still feel the palpitations"   History of Present Illness:    Gregory Benson is a 62 y.o. male with a hx of hypertension, hyperlipidemia.  She presents for follow-up after a recent heart monitor revealed frequent premature ventricular contractions (PVCs). He reports no symptoms associated with the PVCs. The patient's PVCs were quantified at 24%, which is considered high as anything over 5% is deemed frequent. The patient has no known structural heart disease or coronary artery disease. He has a history of high blood pressure and is mindful of his salt intake. He has recently renewed his gym membership and is considering hiring a Systems analyst. He is also considering dietary changes, including reducing red meat and smoked fish consumption.   Past Medical History:  Diagnosis Date   Chest pain    Chronic headaches    Hypertension     Past Surgical History:  Procedure Laterality Date   NO PAST SURGERIES      Current Medications: Current Meds  Medication Sig   atorvastatin (LIPITOR) 20 MG tablet Take 1 tablet (20 mg total) by mouth daily.   chlorthalidone (HYGROTON) 25 MG tablet Take 1 tablet (25 mg total) by mouth daily.   diltiazem (CARDIZEM CD) 120 MG 24 hr capsule Take 1 capsule (120 mg total) by mouth daily.   ketoconazole (NIZORAL) 2 % shampoo Apply 1 application topically 2 (two) times a week.   Multiple Vitamin (MULTIVITAMIN ADULT PO) Take by mouth.   sildenafil (VIAGRA) 100 MG tablet Take 0.5 tablets (50 mg total) by mouth daily as needed for erectile dysfunction.     Allergies:   Patient has no known allergies.   Social History   Socioeconomic History   Marital status: Legally Separated    Spouse name: Not on file   Number of children: Not  on file   Years of education: Not on file   Highest education level: Not on file  Occupational History   Occupation: Estate agent  Tobacco Use   Smoking status: Never   Smokeless tobacco: Never  Vaping Use   Vaping status: Never Used  Substance and Sexual Activity   Alcohol use: Yes    Comment: occ wine   Drug use: No   Sexual activity: Not Currently  Other Topics Concern   Not on file  Social History Narrative   Not on file   Social Determinants of Health   Financial Resource Strain: Not on file  Food Insecurity: Not on file  Transportation Needs: Not on file  Physical Activity: Not on file  Stress: Not on file  Social Connections: Not on file     Family History: The patient's family history includes Heart disease in his brother; Stroke in his father and paternal grandfather. There is no history of Colon cancer, Colon polyps, Esophageal cancer, Rectal cancer, or Stomach cancer.  ROS:   Review of Systems  Constitution: Negative for decreased appetite, fever and weight gain.  HENT: Negative for congestion, ear discharge, hoarse voice and sore throat.   Eyes: Negative for discharge, redness, vision loss in right eye and visual halos.  Cardiovascular: Negative for chest pain, dyspnea on exertion, leg swelling, orthopnea and palpitations.  Respiratory: Negative for cough, hemoptysis, shortness of breath  and snoring.   Endocrine: Negative for heat intolerance and polyphagia.  Hematologic/Lymphatic: Negative for bleeding problem. Does not bruise/bleed easily.  Skin: Negative for flushing, nail changes, rash and suspicious lesions.  Musculoskeletal: Negative for arthritis, joint pain, muscle cramps, myalgias, neck pain and stiffness.  Gastrointestinal: Negative for abdominal pain, bowel incontinence, diarrhea and excessive appetite.  Genitourinary: Negative for decreased libido, genital sores and incomplete emptying.  Neurological: Negative for brief paralysis, focal  weakness, headaches and loss of balance.  Psychiatric/Behavioral: Negative for altered mental status, depression and suicidal ideas.  Allergic/Immunologic: Negative for HIV exposure and persistent infections.    EKGs/Labs/Other Studies Reviewed:    The following studies were reviewed today:   EKG:  The ekg ordered today demonstrates   Recent Labs: 10/09/2022: ALT 11; Hemoglobin 14.3; Platelets 137 10/25/2022: BUN 19; Creatinine, Ser 1.25; Potassium 3.5; Sodium 142  Recent Lipid Panel    Component Value Date/Time   CHOL 119 10/09/2022 1720   TRIG 66 10/09/2022 1720   HDL 51 10/09/2022 1720   CHOLHDL 2.3 10/09/2022 1720   CHOLHDL 3.3 02/27/2019 1153   VLDL 18 02/27/2019 1153   LDLCALC 54 10/09/2022 1720    Physical Exam:    VS:  BP 118/80 (BP Location: Right Arm, Patient Position: Sitting, Cuff Size: Normal)   Pulse 63   Ht 5\' 9"  (1.753 m)   Wt 199 lb 12.8 oz (90.6 kg)   SpO2 98%   BMI 29.51 kg/m     Wt Readings from Last 3 Encounters:  04/03/23 199 lb 12.8 oz (90.6 kg)  01/31/23 193 lb 9.6 oz (87.8 kg)  10/09/22 198 lb 4 oz (89.9 kg)     GEN: Well nourished, well developed in no acute distress HEENT: Normal NECK: No JVD; No carotid bruits LYMPHATICS: No lymphadenopathy CARDIAC: S1S2 noted,RRR, no murmurs, rubs, gallops RESPIRATORY:  Clear to auscultation without rales, wheezing or rhonchi  ABDOMEN: Soft, non-tender, non-distended, +bowel sounds, no guarding. EXTREMITIES: No edema, No cyanosis, no clubbing MUSCULOSKELETAL:  No deformity  SKIN: Warm and dry NEUROLOGIC:  Alert and oriented x 3, non-focal PSYCHIATRIC:  Normal affect, good insight  ASSESSMENT:    1. Frequent PVCs   2. Primary hypertension   3. Mixed hyperlipidemia    PLAN:    Premature Ventricular Contractions (PVCs) Frequent PVCs noted on recent monitoring (24% of beats). No associated symptoms reported. Discussed the need for treatment due to high frequency of PVCs. Start Cardizem to reduce  frequency of PVCs. Repeat heart monitor in 6 months to assess response to treatment.  Diet and Lifestyle Patient inquiring about dietary changes and exercise. Discussed the importance of a healthy diet and regular exercise in overall cardiovascular health. Encouraged reduction in red meat and salt intake. Encouraged regular exercise. Advised against smoked foods due to potential long-term health risks. Schedule follow-up in 6 months.  The patient is in agreement with the above plan. The patient left the office in stable condition.  The patient will follow up in   Medication Adjustments/Labs and Tests Ordered: Current medicines are reviewed at length with the patient today.  Concerns regarding medicines are outlined above.  No orders of the defined types were placed in this encounter.  Meds ordered this encounter  Medications   diltiazem (CARDIZEM CD) 120 MG 24 hr capsule    Sig: Take 1 capsule (120 mg total) by mouth daily.    Dispense:  90 capsule    Refill:  3    Patient Instructions  Medication Instructions:  START CARDIZEM 120 MG DAILY.    Lab Work: NONE    Testing/Procedures: NONE   Follow-Up: At [facility], you and your health needs are our priority.  As part of our continuing mission to provide you with exceptional heart care, we have created designated Provider Care Teams.  These Care Teams include your primary Cardiologist (physician) and Advanced Practice Providers (APPs -  Physician Assistants and Nurse Practitioners) who all work together to provide you with the care you need, when you need it.  We recommend signing up for the patient portal called "MyChart".  Sign up information is provided on this After Visit Summary.  MyChart is used to connect with patients for Virtual Visits (Telemedicine).  Patients are able to view lab/test results, encounter notes, upcoming appointments, etc.  Non-urgent messages can be sent to your provider as well.   To  learn more about what you can do with MyChart, go to [email or web address].    Your next appointment:   6 month(s)  Provider:   Thomasene Ripple, DO    Other Instructions Mediterranean Diet A Mediterranean diet is based on the traditions of countries on the [place]. It focuses on eating more: Fruits and vegetables. Whole grains, beans, nuts, and seeds. Heart-healthy fats. These are fats that are good for your heart. It involves eating less: Dairy. Meat and eggs. Processed foods with added sugar, salt, and fat. This type of diet can help prevent certain conditions. It can also improve outcomes if you have a long-term (chronic) disease, such as kidney or heart disease. What are tips for following this plan? Reading food labels Check packaged foods for: The serving size. For foods such as rice and pasta, the serving size is the amount of cooked product, not dry. The total fat. Avoid foods with saturated fat or trans fat. Added sugars, such as corn syrup. Shopping  Try to have a balanced diet. Buy a variety of foods, such as: Fresh fruits and vegetables. You may be able to get these from local farmers markets. You can also buy them frozen. Grains, beans, nuts, and seeds. Some of these can be bought in bulk. Fresh seafood. Poultry and eggs. Low-fat dairy products. Buy whole ingredients instead of foods that have already been packaged. If you can't get fresh seafood, buy precooked frozen shrimp or canned fish, such as tuna, salmon, or sardines. Stock your pantry so you always have certain foods on hand, such as olive oil, canned tuna, canned tomatoes, rice, pasta, and beans. Cooking Cook foods with extra-virgin olive oil instead of using butter or other vegetable oils. Have meat as a side dish. Have vegetables or grains as your main dish. This means having meat in small portions or adding small amounts of meat to foods like pasta or stew. Use beans or vegetables instead of  meat in common dishes like chili or lasagna. Try out different cooking methods. Try roasting, broiling, steaming, and sauting vegetables. Add frozen vegetables to soups, stews, pasta, or rice. Add nuts or seeds for added healthy fats and plant protein at each meal. You can add these to yogurt, salads, or vegetable dishes. Marinate fish or vegetables using olive oil, lemon juice, garlic, and fresh herbs. Meal planning Plan to eat a vegetarian meal one day each week. Try to work up to two vegetarian meals, if possible. Eat seafood two or more times a week. Have healthy snacks on hand. These may include: Vegetable sticks with hummus. Greek yogurt. Fruit and nut trail  mix. Eat balanced meals. These should include: Fruit: 2-3 servings a day. Vegetables: 4-5 servings a day. Low-fat dairy: 2 servings a day. Fish, poultry, or lean meat: 1 serving a day. Beans and legumes: 2 or more servings a week. Nuts and seeds: 1-2 servings a day. Whole grains: 6-8 servings a day. Extra-virgin olive oil: 3-4 servings a day. Limit red meat and sweets to just a few servings a month. Lifestyle  Try to cook and eat meals with your family. Drink enough fluid to keep your pee (urine) pale yellow. Be active every day. This includes: Aerobic exercise, which is exercise that causes your heart to beat faster. Examples include running and swimming. Leisure activities like gardening, walking, or housework. Get 7-8 hours of sleep each night. Drink red wine if your provider says you can. A glass of wine is 5 oz (150 mL). You may be allowed to have: Up to 1 glass a day if you're male and not pregnant. Up to 2 glasses a day if you're male. What foods should I eat? Fruits Apples. Apricots. Avocado. Berries. Bananas. Cherries. Dates. Figs. Grapes. Lemons. Melon. Oranges. Peaches. Plums. Pomegranate. Vegetables Artichokes. Beets. Broccoli. Cabbage. Carrots. Eggplant. Green beans. Chard. Kale. Spinach. Onions. Leeks.  Peas. Squash. Tomatoes. Peppers. Radishes. Grains Whole-grain pasta. Brown rice. Bulgur wheat. Polenta. Couscous. Whole-wheat bread. Orpah Cobb. Meats and other proteins Beans. Almonds. Sunflower seeds. Pine nuts. Peanuts. Cod. Salmon. Scallops. Shrimp. Tuna. Tilapia. Clams. Oysters. Eggs. Chicken or [place] without skin. Dairy Low-fat milk. Cheese. Greek yogurt. Fats and oils Extra-virgin olive oil. Avocado oil. Grapeseed oil. Beverages Water. Red wine. Herbal tea. Sweets and desserts Greek yogurt with honey. Baked apples. Poached pears. Trail mix. Seasonings and condiments Basil. Cilantro. Coriander. Cumin. Mint. Parsley. Sage. Rosemary. Tarragon. Garlic. Oregano. Thyme. Pepper. Balsamic vinegar. Tahini. Hummus. Tomato sauce. Olives. Mushrooms. The items listed above may not be all the foods and drinks you can have. Talk to a dietitian to learn more. What foods should I limit? This is a list of foods that should be eaten rarely. Fruits Fruit canned in syrup. Vegetables Deep-fried potatoes, like [place] fries. Grains Packaged pasta or rice dishes. Cereal with added sugar. Snacks with added sugar. Meats and other proteins Beef. Pork. Lamb. Chicken or [place] with skin. Hot dogs. Tomasa Blase. Dairy Ice cream. Sour cream. Whole milk. Fats and oils Butter. Canola oil. Vegetable oil. Beef fat (tallow). Lard. Beverages Juice. Sugar-sweetened soft drinks. Beer. Liquor and spirits. Sweets and desserts Cookies. Cakes. Pies. Candy. Seasonings and condiments Mayonnaise. Pre-made sauces and marinades. The items listed above may not be all the foods and drinks you should limit. Talk to a dietitian to learn more. Where to find more information American Heart Association (AHA): heart.org This information is not intended to replace advice given to you by your health care provider. Make sure you discuss any questions you have with your health care provider. Document Revised: 09/10/2022 Document  Reviewed: 09/10/2022 Elsevier Patient Education  2024 Elsevier Inc.     Adopting a Healthy Lifestyle.  Know what a healthy weight is for you (roughly BMI <25) and aim to maintain this   Aim for 7+ servings of fruits and vegetables daily   65-80+ fluid ounces of water or unsweet tea for healthy kidneys   Limit to max 1 drink of alcohol per day; avoid smoking/tobacco   Limit animal fats in diet for cholesterol and heart health - choose grass fed whenever available   Avoid highly processed foods, and foods high in  saturated/trans fats   Aim for low stress - take time to unwind and care for your mental health   Aim for 150 min of moderate intensity exercise weekly for heart health, and weights twice weekly for bone health   Aim for 7-9 hours of sleep daily   When it comes to diets, agreement about the perfect plan isnt easy to find, even among the experts. Experts at the [facility] of [employer] developed an idea known as the Healthy Eating Plate. Just imagine a plate divided into logical, healthy portions.   The emphasis is on diet quality:   Load up on vegetables and fruits - one-half of your plate: Aim for color and variety, and remember that potatoes dont count.   Go for whole grains - one-quarter of your plate: Whole wheat, barley, wheat berries, quinoa, oats, brown rice, and foods made with them. If you want pasta, go with whole wheat pasta.   Protein power - one-quarter of your plate: Fish, chicken, beans, and nuts are all healthy, versatile protein sources. Limit red meat.   The diet, however, does go beyond the plate, offering a few other suggestions.   Use healthy plant oils, such as olive, canola, soy, corn, sunflower and peanut. Check the labels, and avoid partially hydrogenated oil, which have unhealthy trans fats.   If youre thirsty, drink water. Coffee and tea are good in moderation, but skip sugary drinks and limit milk and dairy products to one or two  daily servings.   The type of carbohydrate in the diet is more important than the amount. Some sources of carbohydrates, such as vegetables, fruits, whole grains, and beans-are healthier than others.   Finally, stay active  Signed, Thomasene Ripple, DO  04/03/2023 1:19 PM    [facility]

## 2023-04-19 ENCOUNTER — Other Ambulatory Visit (HOSPITAL_COMMUNITY): Payer: Self-pay

## 2023-04-19 ENCOUNTER — Other Ambulatory Visit: Payer: Self-pay | Admitting: Student

## 2023-04-19 DIAGNOSIS — E785 Hyperlipidemia, unspecified: Secondary | ICD-10-CM

## 2023-04-19 DIAGNOSIS — I10 Essential (primary) hypertension: Secondary | ICD-10-CM

## 2023-04-19 MED ORDER — CHLORTHALIDONE 25 MG PO TABS
25.0000 mg | ORAL_TABLET | Freq: Every day | ORAL | 0 refills | Status: DC
  Filled 2023-04-19: qty 90, 90d supply, fill #0

## 2023-04-19 MED ORDER — ATORVASTATIN CALCIUM 20 MG PO TABS
20.0000 mg | ORAL_TABLET | Freq: Every day | ORAL | 0 refills | Status: DC
Start: 2023-04-19 — End: 2023-05-01
  Filled 2023-04-19: qty 90, 90d supply, fill #0

## 2023-04-20 ENCOUNTER — Other Ambulatory Visit (HOSPITAL_COMMUNITY): Payer: Self-pay

## 2023-05-01 ENCOUNTER — Ambulatory Visit (INDEPENDENT_AMBULATORY_CARE_PROVIDER_SITE_OTHER): Payer: 59 | Admitting: Student

## 2023-05-01 ENCOUNTER — Encounter: Payer: Self-pay | Admitting: Student

## 2023-05-01 ENCOUNTER — Other Ambulatory Visit (HOSPITAL_COMMUNITY): Payer: Self-pay

## 2023-05-01 VITALS — BP 124/67 | HR 64 | Ht 70.0 in | Wt 196.5 lb

## 2023-05-01 DIAGNOSIS — I10 Essential (primary) hypertension: Secondary | ICD-10-CM

## 2023-05-01 DIAGNOSIS — E785 Hyperlipidemia, unspecified: Secondary | ICD-10-CM | POA: Diagnosis not present

## 2023-05-01 DIAGNOSIS — Z23 Encounter for immunization: Secondary | ICD-10-CM | POA: Diagnosis not present

## 2023-05-01 DIAGNOSIS — I493 Ventricular premature depolarization: Secondary | ICD-10-CM | POA: Diagnosis not present

## 2023-05-01 MED ORDER — CHLORTHALIDONE 25 MG PO TABS
25.0000 mg | ORAL_TABLET | Freq: Every day | ORAL | 2 refills | Status: DC
  Filled 2023-05-01 – 2023-07-12 (×2): qty 90, 90d supply, fill #0
  Filled 2023-10-18: qty 90, 90d supply, fill #1
  Filled 2024-02-01 (×2): qty 90, 90d supply, fill #2

## 2023-05-01 MED ORDER — ATORVASTATIN CALCIUM 20 MG PO TABS
20.0000 mg | ORAL_TABLET | Freq: Every day | ORAL | 2 refills | Status: DC
  Filled 2023-05-01 – 2023-07-12 (×2): qty 90, 90d supply, fill #0
  Filled 2023-10-18: qty 90, 90d supply, fill #1
  Filled 2024-02-01: qty 90, 90d supply, fill #2

## 2023-05-01 MED ORDER — DILTIAZEM HCL ER COATED BEADS 120 MG PO CP24
120.0000 mg | ORAL_CAPSULE | Freq: Every day | ORAL | 3 refills | Status: DC
  Filled 2023-05-01 – 2023-07-12 (×2): qty 90, 90d supply, fill #0
  Filled 2023-10-18: qty 90, 90d supply, fill #1
  Filled 2024-02-01: qty 90, 90d supply, fill #2

## 2023-05-01 NOTE — Assessment & Plan Note (Signed)
Continue diltiazem that was started by cardiology -6 months follow-up with cardiology

## 2023-05-01 NOTE — Assessment & Plan Note (Signed)
Refilled atorvastatin 20 mg daily 

## 2023-05-01 NOTE — Patient Instructions (Signed)
It was great to see you! Thank you for allowing me to participate in your care!   Our plans for today:  - Continue your medicines as you have been taking - I have refilled your medicines  Take care and seek immediate care sooner if you develop any concerns.  Levin Erp, MD

## 2023-05-01 NOTE — Progress Notes (Signed)
    SUBJECTIVE:   CHIEF COMPLAINT / HPI:   Patient is a 62 y.o. male who present today for follow up of hypertension/frequent PVCs  Patient endorses no problems.  Has any sensation palpitations, denies any lightheadedness or leg swelling.  Nuys any chest pain.  Home medications include: chlorthalidone 25, diltiazem 120 Patient endorses taking these medications as prescribed. Denies any headache, vision changes, shortness of breath, lower extremity swelling or chest pain   Most recent creatinine trend:  Lab Results  Component Value Date   CREATININE 1.25 10/25/2022   CREATININE 1.32 (H) 10/09/2022   CREATININE 1.06 07/21/2021   He has been adhering to his prescribed medication, Diltiazem, and has made lifestyle changes, including adopting a Mediterranean diet and reducing his work hours.   PERTINENT  PMH / PSH: reviewed  OBJECTIVE:   BP 124/67   Pulse 64   Ht 5\' 10"  (1.778 m)   Wt 196 lb 8 oz (89.1 kg)   SpO2 99%   BMI 28.19 kg/m   General: Well appearing, NAD, awake, alert, responsive to questions Head: Normocephalic atraumatic CV: Regular rate and rhythm intermittent extra beats Respiratory: Clear to ausculation bilaterally, no wheezes rales or crackles, chest rises symmetrically,  no increased work of breathing Abdomen: Soft, non-tender, non-distended, normoactive bowel sounds  Extremities: Moves upper and lower extremities freely, no edema in LE  ASSESSMENT/PLAN:   Assessment & Plan Hypertension, unspecified type Well controlled today. -Continue diltiazem 120 mg daily -Continue chlorthalidone 25 mg daily Frequent PVCs Continue diltiazem that was started by cardiology -6 months follow-up with cardiology Hyperlipidemia, unspecified hyperlipidemia type -Refilled atorvastatin 20 mg daily Encounter for immunization -Flu and COVID vaccines provided today    Levin Erp, MD [facility]

## 2023-05-01 NOTE — Assessment & Plan Note (Signed)
Well controlled today. -Continue diltiazem 120 mg daily -Continue chlorthalidone 25 mg daily

## 2023-05-07 ENCOUNTER — Encounter: Payer: Self-pay | Admitting: Internal Medicine

## 2023-05-18 ENCOUNTER — Telehealth: Payer: Self-pay | Admitting: Cardiology

## 2023-05-18 NOTE — Telephone Encounter (Signed)
I did not need this encounter. °

## 2023-05-21 ENCOUNTER — Ambulatory Visit: Payer: 59 | Admitting: Cardiology

## 2023-07-12 ENCOUNTER — Other Ambulatory Visit (HOSPITAL_COMMUNITY): Payer: Self-pay

## 2023-09-05 NOTE — Telephone Encounter (Signed)
error 

## 2023-10-18 ENCOUNTER — Other Ambulatory Visit: Payer: Self-pay

## 2023-10-18 ENCOUNTER — Other Ambulatory Visit (HOSPITAL_COMMUNITY): Payer: Self-pay

## 2024-02-01 ENCOUNTER — Other Ambulatory Visit: Payer: Self-pay

## 2024-02-01 ENCOUNTER — Other Ambulatory Visit (HOSPITAL_COMMUNITY): Payer: Self-pay

## 2024-02-15 ENCOUNTER — Emergency Department (HOSPITAL_COMMUNITY)

## 2024-02-15 ENCOUNTER — Other Ambulatory Visit (HOSPITAL_COMMUNITY): Payer: Self-pay

## 2024-02-15 ENCOUNTER — Encounter (HOSPITAL_COMMUNITY): Payer: Self-pay

## 2024-02-15 ENCOUNTER — Emergency Department (HOSPITAL_COMMUNITY)
Admission: EM | Admit: 2024-02-15 | Discharge: 2024-02-15 | Disposition: A | Discharge status: Home / Self Care | Attending: Emergency Medicine | Admitting: Emergency Medicine

## 2024-02-15 ENCOUNTER — Other Ambulatory Visit: Payer: Self-pay

## 2024-02-15 DIAGNOSIS — M542 Cervicalgia: Secondary | ICD-10-CM | POA: Diagnosis not present

## 2024-02-15 DIAGNOSIS — Y92410 Unspecified street and highway as the place of occurrence of the external cause: Secondary | ICD-10-CM | POA: Insufficient documentation

## 2024-02-15 DIAGNOSIS — M47812 Spondylosis without myelopathy or radiculopathy, cervical region: Secondary | ICD-10-CM | POA: Diagnosis not present

## 2024-02-15 DIAGNOSIS — E041 Nontoxic single thyroid nodule: Secondary | ICD-10-CM | POA: Diagnosis not present

## 2024-02-15 DIAGNOSIS — M79672 Pain in left foot: Secondary | ICD-10-CM | POA: Diagnosis not present

## 2024-02-15 DIAGNOSIS — M4802 Spinal stenosis, cervical region: Secondary | ICD-10-CM | POA: Diagnosis not present

## 2024-02-15 DIAGNOSIS — M791 Myalgia, unspecified site: Secondary | ICD-10-CM | POA: Diagnosis not present

## 2024-02-15 DIAGNOSIS — S199XXA Unspecified injury of neck, initial encounter: Secondary | ICD-10-CM | POA: Diagnosis not present

## 2024-02-15 DIAGNOSIS — M7918 Myalgia, other site: Secondary | ICD-10-CM

## 2024-02-15 DIAGNOSIS — M79662 Pain in left lower leg: Secondary | ICD-10-CM | POA: Diagnosis not present

## 2024-02-15 DIAGNOSIS — M545 Low back pain, unspecified: Secondary | ICD-10-CM | POA: Diagnosis not present

## 2024-02-15 MED ORDER — LIDOCAINE 5 % EX PTCH
1.0000 | MEDICATED_PATCH | CUTANEOUS | Status: DC
  Administered 2024-02-15: 1 via TRANSDERMAL
  Filled 2024-02-15: qty 1

## 2024-02-15 MED ORDER — METHOCARBAMOL 500 MG PO TABS
1000.0000 mg | ORAL_TABLET | Freq: Two times a day (BID) | ORAL | 0 refills | Status: AC
  Filled 2024-02-15: qty 20, 5d supply, fill #0

## 2024-02-15 MED ORDER — LIDOCAINE 5 % EX PTCH
1.0000 | MEDICATED_PATCH | CUTANEOUS | 0 refills | Status: AC
  Filled 2024-02-15: qty 30, 30d supply, fill #0

## 2024-02-15 MED ORDER — KETOROLAC TROMETHAMINE 15 MG/ML IJ SOLN
15.0000 mg | Freq: Once | INTRAMUSCULAR | Status: AC
Start: 2024-02-15 — End: 2024-02-15
  Administered 2024-02-15: 15 mg via INTRAMUSCULAR
  Filled 2024-02-15: qty 1
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
## 2024-02-15 NOTE — ED Notes (Signed)
 NT applied c-collar

## 2024-02-21 ENCOUNTER — Encounter: Payer: Self-pay | Admitting: Student

## 2024-02-21 ENCOUNTER — Ambulatory Visit (INDEPENDENT_AMBULATORY_CARE_PROVIDER_SITE_OTHER): Admitting: Student

## 2024-02-21 ENCOUNTER — Other Ambulatory Visit (HOSPITAL_COMMUNITY): Payer: Self-pay

## 2024-02-21 ENCOUNTER — Telehealth: Payer: Self-pay

## 2024-02-21 VITALS — BP 118/74 | HR 66 | Ht 70.5 in | Wt 189.8 lb

## 2024-02-21 DIAGNOSIS — Z23 Encounter for immunization: Secondary | ICD-10-CM

## 2024-02-21 DIAGNOSIS — Z131 Encounter for screening for diabetes mellitus: Secondary | ICD-10-CM | POA: Diagnosis not present

## 2024-02-21 DIAGNOSIS — E785 Hyperlipidemia, unspecified: Secondary | ICD-10-CM | POA: Diagnosis not present

## 2024-02-21 DIAGNOSIS — I493 Ventricular premature depolarization: Secondary | ICD-10-CM | POA: Diagnosis not present

## 2024-02-21 DIAGNOSIS — Z0000 Encounter for general adult medical examination without abnormal findings: Secondary | ICD-10-CM

## 2024-02-21 DIAGNOSIS — I10 Essential (primary) hypertension: Secondary | ICD-10-CM

## 2024-02-21 LAB — POCT GLYCOSYLATED HEMOGLOBIN (HGB A1C): Hemoglobin A1C: 5 % (ref 4.0–5.6)

## 2024-02-21 MED ORDER — DILTIAZEM HCL ER COATED BEADS 120 MG PO CP24
120.0000 mg | ORAL_CAPSULE | Freq: Every day | ORAL | 3 refills | Status: AC
  Filled 2024-02-21 – 2024-05-22 (×2): qty 90, 90d supply, fill #0
  Filled 2024-06-19: qty 30, 30d supply, fill #0

## 2024-02-21 MED ORDER — CHLORTHALIDONE 25 MG PO TABS
25.0000 mg | ORAL_TABLET | Freq: Every day | ORAL | 2 refills | Status: AC
  Filled 2024-02-21: qty 90, 90d supply, fill #0

## 2024-02-21 MED ORDER — ATORVASTATIN CALCIUM 20 MG PO TABS
20.0000 mg | ORAL_TABLET | Freq: Every day | ORAL | 2 refills | Status: AC
  Filled 2024-02-21 – 2024-05-22 (×2): qty 90, 90d supply, fill #0
  Filled 2024-06-19: qty 30, 30d supply, fill #0
  Filled 2024-07-18: qty 30, 30d supply, fill #1

## 2024-02-21 NOTE — Progress Notes (Signed)
    SUBJECTIVE:   Chief compliant/HPI: annual examination  Gregory Benson is a 63 y.o. who presents today for an annual exam.   History tabs reviewed and updated.  Needs refills of his medications. Needs f/u with cardiology. Question when next colonoscopy is due, do not see pathology results in chart.  OBJECTIVE:   BP 118/74   Pulse 66   Ht 5' 10.5 (1.791 m)   Wt 189 lb 12.8 oz (86.1 kg)   SpO2 98%   BMI 26.85 kg/m    General: NAD, pleasant Cardio: RRR, no MRG. Respiratory: CTAB, normal wob on RA GI: Abdomen is soft, not tender, not distended. BS present Skin: Warm and dry  ASSESSMENT/PLAN:   Assessment & Plan Annual visit for general adult medical examination without abnormal findings Blood pressure value is at goal, discussed.   Considered the following screening exams based upon USPSTF recommendations: Diabetes screening: ordered Screening for elevated cholesterol: ordered HIV testing: Not ordered Hepatitis C: Not ordered  Hepatitis B: No ordered Syphilis if at high risk: Not high risk Reviewed risk factors for latent tuberculosis and Not discussed. Colorectal cancer screening: Dr. Albertus messaged me back, he is due for colonoscopy now- his office will call to schedule. Greatly appreciate Dr. Pamula assistance in this patient's care. PSA previously documented normal 2 years ago.  Follow up in 1 year or sooner if indicated.  MyChart Activation: Signed up today Hyperlipidemia, unspecified hyperlipidemia type -Refill lipitor -Lipid panel today Frequent PVCs Asymptomatic. Due for Cards follow-up -Cards # provided -Refilled Cardizem  120 mg daily  MyChart Activation: Signed up today  Gladis Church, DO [facility]

## 2024-02-21 NOTE — Telephone Encounter (Signed)
 Called patient and explained he is due for his colonoscopy for personal history of colon polyps. Patient scheduled colonoscopy with Dr. Albertus on 04/04/24 at 2:30 pm and pre-visit on 03/21/24 at 2:30 pm. Patient is aware of appts.

## 2024-02-21 NOTE — Assessment & Plan Note (Signed)
 Asymptomatic. Due for Cards follow-up -Cards # provided -Refilled Cardizem  120 mg daily

## 2024-02-21 NOTE — Assessment & Plan Note (Signed)
-  Refill lipitor -Lipid panel today

## 2024-02-21 NOTE — Patient Instructions (Addendum)
 It was great to see you! Thank you for allowing me to participate in your care!   I recommend that you always bring your medications to each appointment as this makes it easy to ensure we are on the correct medications and helps us  not miss when refills are needed.  Our plans for today:  - Please call [phone] to schedule your appointment with cardiology - We are checking some labs today, I will call you if they are abnormal will send you a MyChart message or a letter if they are normal.  If you do not hear about your labs in the next 2 weeks please let us  know.  Take care and seek immediate care sooner if you develop any concerns. Please remember to show up 15 minutes before your scheduled appointment time!  Gladis Church, DO [facility]

## 2024-02-21 NOTE — Telephone Encounter (Signed)
 RE: [employer] Patient Received: Today Pyrtle, Gregory HERO, MD  Howell Lunger, DO; Gregory Benson, CMA He is due now I will ask office to reach out Thanks Gregory Benson. Pyrtle, M.D.  02/21/2024  Alan, This pt needs surveillance colonoscopy for hx of polyps JMP       Previous Messages    ----- Message ----- From: Howell Lunger, DO Sent: 02/21/2024   1:50 PM EDT To: Gregory Benson Starch, MD Subject: [employer] Patient                        Hey Dr. Starch,  I saw this patient for his annual exam. Struggled to see the pathology report of his colonoscopy, when is he due for repeat?  All the best, Milderd Howell PGY-3

## 2024-02-22 LAB — LIPID PANEL
Chol/HDL Ratio: 2.7 ratio (ref 0.0–5.0)
Cholesterol, Total: 128 mg/dL (ref 100–199)
HDL: 47 mg/dL (ref >39)
LDL Chol Calc (NIH): 62 mg/dL (ref 0–99)
Triglycerides: 104 mg/dL (ref 0–149)
VLDL Cholesterol Cal: 19 mg/dL (ref 5–40)

## 2024-02-22 LAB — BASIC METABOLIC PANEL WITH GFR
BUN/Creatinine Ratio: 17 (ref 10–24)
BUN: 22 mg/dL (ref 8–27)
CO2: 28 mmol/L (ref 20–29)
Calcium: 10 mg/dL (ref 8.6–10.2)
Chloride: 98 mmol/L (ref 96–106)
Creatinine, Ser: 1.3 mg/dL — ABNORMAL HIGH (ref 0.76–1.27)
Glucose: 88 mg/dL (ref 70–99)
Potassium: 3.3 mmol/L — ABNORMAL LOW (ref 3.5–5.2)
Sodium: 140 mmol/L (ref 134–144)
eGFR: 62 mL/min/1.73 (ref >59)

## 2024-02-25 ENCOUNTER — Telehealth: Payer: Self-pay | Admitting: Student

## 2024-02-25 DIAGNOSIS — E876 Hypokalemia: Secondary | ICD-10-CM

## 2024-02-25 NOTE — Telephone Encounter (Signed)
 Patient returns call to nurse line.   Lengthy discussion on lab results and the need for repeat labs.  Patient scheduled for 10/16 for BMP.

## 2024-02-25 NOTE — Telephone Encounter (Signed)
 Called patient, unable to reach, left HIPAA compliant VM to call back our clinic.  If patient calls back please inform: Recommend patient have lab appointment in 1 month for repeat basic metabolic panel.  I have placed this order.  His kidney function was slightly worse from a year ago, and he has low potassium likely due to his chlorthalidone .  I recommend he eat potassium rich foods including bananas, spinach, beans.

## 2024-03-21 ENCOUNTER — Other Ambulatory Visit (HOSPITAL_COMMUNITY): Payer: Self-pay

## 2024-03-21 ENCOUNTER — Ambulatory Visit: Payer: Self-pay | Admitting: *Deleted

## 2024-03-21 VITALS — Ht 70.5 in | Wt 193.0 lb

## 2024-03-21 DIAGNOSIS — Z860100 Personal history of colon polyps, unspecified: Secondary | ICD-10-CM

## 2024-03-21 MED ORDER — SUTAB 1479-225-188 MG PO TABS
ORAL_TABLET | ORAL | 0 refills | Status: DC
  Filled 2024-03-21: qty 24, 2d supply, fill #0

## 2024-03-21 NOTE — Progress Notes (Addendum)
 Pt's name and DOB verified at the beginning of the pre-visit with 2 identifiers   Pt denies any difficulty with ambulating,sitting, laying down or rolling side to side  Pt has no issues moving head neck or swallowing  No egg or soy allergy known to patient   No issues known to pt with past sedation  No FH of Malignant Hyperthermia  Pt is not on home 02   Pt is not on blood thinners   Pt has frequent issues with constipation RN instructed pt to use Miralax per bottles instructions a week before prep days. Pt states they will  Pt is not on dialysis  Pt frequent PVC's  Pt has C no Card MD OV anytime soon per pt  Patient's chart reviewed by Norleen Schillings CNRA prior to pre-visit and patient appropriate for the [facility].  Pre-visit completed and red dot placed by patient's name on their procedure day (on provider's schedule).     Visit by phone  Visit in person  Pt states weight is 193 lb   Pt given  both [facility] main # and MD on call # prior to instructions.  Informed pt to come in at the time discussed and is shown on PV instructions.  Pt instructed to use Single[employer] or GoodRx for a price reduction on prep  Instructed pt where to find PV instructions in My Ch. Copy of instructions  to be sent in mail and address read back to pt to verify correct on envelope. Instructed pt on all aspects of written instructions including med holds clothing to wear and foods to eat and not eat as well as after procedure legal restrictions and to call MD on call if needed.. Pt states understanding. Instructed pt to review instructions again prior to procedure and call main # given if has any questions or any issues. Pt states they will.

## 2024-03-26 ENCOUNTER — Encounter: Payer: Self-pay | Admitting: Internal Medicine

## 2024-03-27 ENCOUNTER — Other Ambulatory Visit

## 2024-03-28 ENCOUNTER — Other Ambulatory Visit

## 2024-03-28 DIAGNOSIS — E876 Hypokalemia: Secondary | ICD-10-CM

## 2024-03-29 LAB — BASIC METABOLIC PANEL WITH GFR
BUN/Creatinine Ratio: 16 (ref 10–24)
BUN: 21 mg/dL (ref 8–27)
CO2: 27 mmol/L (ref 20–29)
Calcium: 10.7 mg/dL — ABNORMAL HIGH (ref 8.6–10.2)
Chloride: 99 mmol/L (ref 96–106)
Creatinine, Ser: 1.29 mg/dL — ABNORMAL HIGH (ref 0.76–1.27)
Glucose: 106 mg/dL — ABNORMAL HIGH (ref 70–99)
Potassium: 3 mmol/L — ABNORMAL LOW (ref 3.5–5.2)
Sodium: 139 mmol/L (ref 134–144)
eGFR: 63 mL/min/1.73 (ref >59)

## 2024-03-30 ENCOUNTER — Ambulatory Visit: Payer: Self-pay | Admitting: Family Medicine

## 2024-03-31 ENCOUNTER — Other Ambulatory Visit (HOSPITAL_COMMUNITY): Payer: Self-pay

## 2024-03-31 ENCOUNTER — Telehealth: Payer: Self-pay | Admitting: Student

## 2024-03-31 DIAGNOSIS — E876 Hypokalemia: Secondary | ICD-10-CM

## 2024-03-31 MED ORDER — POTASSIUM CHLORIDE CRYS ER 10 MEQ PO TBCR
10.0000 meq | EXTENDED_RELEASE_TABLET | Freq: Two times a day (BID) | ORAL | 0 refills | Status: AC
  Filled 2024-03-31: qty 10, 5d supply, fill #0

## 2024-03-31 NOTE — Telephone Encounter (Signed)
 Called patient x 2, no answer.  Left HIPAA compliant voicemail.  If patient calls back, please inform of the following:  1.) Patient has low potassium levels, I have sent in a 5-day course of potassium tablets 10 meq (1 tablet) twice daily for 5 days. 2.) I recommend not taking chlorthalidone  (his blood pressure medication) until he sees us , as this lowers potassium levels 3.) Please schedule an appointment within the next 5 days with PCP or first available provider. 4.) If patient is having palpitations or chest pain, recommend that he is evaluated in the emergency department    **To provider who sees patient next Recommend discussing alternative blood pressure medications, to prevent hypokalemia.  He has had multiple instances of low potassium while on chlorthalidone . Please recheck potassium levels after he has had supplementation to make sure they are rising Consider EKG if having symptoms

## 2024-04-01 NOTE — Telephone Encounter (Signed)
 Patient returns call to nurse line.   Discussed at length the need to start potassium tablets and stop chlormadinone.  He denies any chest pains or palpitations. Discussed the need for sooner evaluation if symptoms occur.   Patient scheduled for Monday 10/27 for FU.

## 2024-04-04 ENCOUNTER — Encounter: Payer: Self-pay | Admitting: Internal Medicine

## 2024-04-07 ENCOUNTER — Ambulatory Visit: Admitting: Family Medicine

## 2024-04-07 ENCOUNTER — Encounter: Payer: Self-pay | Admitting: Family Medicine

## 2024-04-07 VITALS — BP 121/75 | HR 71 | Ht 70.5 in | Wt 196.8 lb

## 2024-04-07 DIAGNOSIS — E876 Hypokalemia: Secondary | ICD-10-CM

## 2024-04-07 DIAGNOSIS — I10 Essential (primary) hypertension: Secondary | ICD-10-CM | POA: Diagnosis not present

## 2024-04-07 NOTE — Patient Instructions (Addendum)
 STOP taking your chlorthalidone  Keep track of your BP every day as below I recommend the brand Omron for blood pressure cuff - available over the counter    Blood Pressure Record Sheet To take your blood pressure, you will need a blood pressure machine. You can buy a blood pressure machine (blood pressure monitor) at your clinic, drug store, or online. When choosing one, consider: An automatic monitor that has an arm cuff. A cuff that wraps snugly around your upper arm. You should be able to fit only one finger between your arm and the cuff. A device that stores blood pressure reading results. Do not choose a monitor that measures your blood pressure from your wrist or finger. Follow your health care provider's instructions for how to take your blood pressure. To use this form: Take your blood pressure medications every day These measurements should be taken when you have been at rest for at least 10-15 min Take at least 2 readings with each blood pressure check. This makes sure the results are correct. Wait 1-2 minutes between measurements. Write down the results in the spaces on this form. Keep in mind it should always be recorded systolic over diastolic. Both numbers are important.  Repeat this every day for 2-3 weeks, or as told by your health care provider.  Make a follow-up appointment with your health care provider to discuss the results.  Blood Pressure Log Date Medications taken? (Y/N) Blood Pressure Time of Day

## 2024-04-07 NOTE — Assessment & Plan Note (Signed)
 BP well-controlled today Hypokalemia and hypercalcemia noted on BMP Chlorthalidone  was stopped about 5 days ago, suspect this was causing the electrolyte imbalances Completed potassium supplementation  Recheck BMP today BP well-controlled, do not feel we need to add on additional medication today.  Advised to keep BP log for the next few weeks.  Follow-up in 4 weeks If still hypokalemic, consider testing for aldosterone related etiology If still hypercalcemic, consider additional workup of this

## 2024-04-07 NOTE — Progress Notes (Signed)
    SUBJECTIVE:   CHIEF COMPLAINT / HPI:   Per Dr. Howell 10/20: Recommended recheck BMP/potassium as patient had low potassium treated with 10 mEq twice daily x 5 days Recommended recheck BP and consider switching off of chlorthalidone  to avoid hypokalemia  He reports he finished the course of potassium supplementation Today, patient denies any symptoms including palpitations or chest pain or shortness of breath Patient is still taking his chlorthalidone  25 mg daily Other pertinent meds include diltiazem  120 mg daily, Lipitor 20 mg daily    PERTINENT  PMH / PSH: HTN, HLD, history of PVCs, erectile dysfunction  OBJECTIVE:   BP 121/75   Pulse 71   Ht 5' 10.5 (1.791 m)   Wt 196 lb 12.8 oz (89.3 kg)   SpO2 99%   BMI 27.84 kg/m    General: NAD, pleasant, able to participate in exam Cardiac: RRR, no murmurs auscultated Respiratory: CTAB, normal WOB Abdomen: soft, non-tender, non-distended, normoactive bowel sounds Extremities: warm and well perfused, no edema or cyanosis Skin: warm and dry, no rashes noted Neuro: alert, no obvious focal deficits, speech normal Psych: Normal affect and mood  ASSESSMENT/PLAN:    Assessment & Plan Hypertension, unspecified type Hypokalemia Hypercalcemia BP well-controlled today Hypokalemia and hypercalcemia noted on BMP Chlorthalidone  was stopped about 5 days ago, suspect this was causing the electrolyte imbalances Completed potassium supplementation  Recheck BMP today BP well-controlled, do not feel we need to add on additional medication today.  Advised to keep BP log for the next few weeks.  Follow-up in 4 weeks If still hypokalemic, consider testing for aldosterone related etiology If still hypercalcemic, consider additional workup of this   Gregory Coward, MD [facility]

## 2024-04-08 ENCOUNTER — Ambulatory Visit: Payer: Self-pay | Admitting: Family Medicine

## 2024-04-08 LAB — BASIC METABOLIC PANEL WITH GFR
BUN/Creatinine Ratio: 12 (ref 10–24)
BUN: 14 mg/dL (ref 8–27)
CO2: 25 mmol/L (ref 20–29)
Calcium: 10.1 mg/dL (ref 8.6–10.2)
Chloride: 104 mmol/L (ref 96–106)
Creatinine, Ser: 1.14 mg/dL (ref 0.76–1.27)
Glucose: 80 mg/dL (ref 70–99)
Potassium: 3.6 mmol/L (ref 3.5–5.2)
Sodium: 144 mmol/L (ref 134–144)
eGFR: 73 mL/min/1.73 (ref >59)

## 2024-05-16 ENCOUNTER — Encounter: Payer: Self-pay | Admitting: Internal Medicine

## 2024-05-22 ENCOUNTER — Other Ambulatory Visit (HOSPITAL_COMMUNITY): Payer: Self-pay

## 2024-06-18 ENCOUNTER — Telehealth: Payer: Self-pay | Admitting: Internal Medicine

## 2024-06-18 NOTE — Telephone Encounter (Signed)
 Call from pt to confirm colonoscopy 07/04/2024. Pt stated he has questions regarding the prep instructions.  Please advise thank you\.

## 2024-06-18 NOTE — Telephone Encounter (Signed)
 Returned call to patient, he has r/s'd his colonoscopy to 1/23; PV was back in October and is over 90 days.  He has questions about his prep.  Patient informed he will need another PV, he verbalized understanding and request in-person PV. PV scheduled for Friday, 06-27-24 @ 1:00, in person. SChaplin, RN,BSN

## 2024-06-19 ENCOUNTER — Other Ambulatory Visit (HOSPITAL_COMMUNITY): Payer: Self-pay

## 2024-06-27 ENCOUNTER — Ambulatory Visit: Payer: Self-pay

## 2024-06-27 VITALS — Ht 70.0 in | Wt 195.0 lb

## 2024-06-27 DIAGNOSIS — Z860100 Personal history of colon polyps, unspecified: Secondary | ICD-10-CM

## 2024-06-27 NOTE — Progress Notes (Signed)
 No egg or soy allergy known to patient  No issues known to pt with past sedation with any surgeries or procedures Patient denies ever being told they had issues or difficulty with intubation  No FH of Malignant Hyperthermia Pt is not on diet pills Pt is not on  home 02  Pt is not on blood thinners  Pt denies issues with constipation  No A fib or A flutter Have any cardiac testing pending--March yearly exam Pt can ambulate independently Pt denies use of chewing tobacco Discussed diabetic I weight loss medication holds Discussed NSAID holds Checked BMI Pt instructed to use [employer] or GoodRx for a price reduction on prep  Patient's chart reviewed by Norleen Schillings CNRA prior to previsit and patient appropriate for the [facility].  Pre visit completed and red dot placed by patient's name on their procedure day (on provider's schedule).

## 2024-06-30 ENCOUNTER — Encounter: Payer: Self-pay | Admitting: Internal Medicine

## 2024-07-03 ENCOUNTER — Telehealth: Payer: Self-pay | Admitting: Internal Medicine

## 2024-07-03 NOTE — Telephone Encounter (Signed)
 Inbound call from patient stating he mistakenly took prep this morning at 6 am instead of 6 pm. Patient is requesting a call to be advise on how he can still proceed with procedure scheduled for tomorrow. Please advise, thank you

## 2024-07-03 NOTE — Telephone Encounter (Signed)
 Returned the patient's phone call. Instructed him to take half Miralax prep at 6 pm tonight and finish the rest of the Sutab  prep in the morning. Pt verbalized understanding.

## 2024-07-04 ENCOUNTER — Ambulatory Visit: Payer: Self-pay | Admitting: Internal Medicine

## 2024-07-04 ENCOUNTER — Encounter: Payer: Self-pay | Admitting: Internal Medicine

## 2024-07-04 VITALS — BP 114/76 | HR 55 | Temp 97.2°F | Resp 14 | Ht 70.0 in | Wt 195.0 lb

## 2024-07-04 DIAGNOSIS — K621 Rectal polyp: Secondary | ICD-10-CM

## 2024-07-04 DIAGNOSIS — K635 Polyp of colon: Secondary | ICD-10-CM | POA: Diagnosis not present

## 2024-07-04 DIAGNOSIS — Z860100 Personal history of colon polyps, unspecified: Secondary | ICD-10-CM

## 2024-07-04 DIAGNOSIS — Z860101 Personal history of adenomatous and serrated colon polyps: Secondary | ICD-10-CM

## 2024-07-04 DIAGNOSIS — K648 Other hemorrhoids: Secondary | ICD-10-CM | POA: Diagnosis not present

## 2024-07-04 DIAGNOSIS — D128 Benign neoplasm of rectum: Secondary | ICD-10-CM

## 2024-07-04 DIAGNOSIS — Z1211 Encounter for screening for malignant neoplasm of colon: Secondary | ICD-10-CM | POA: Diagnosis present

## 2024-07-04 MED ORDER — SODIUM CHLORIDE 0.9 % IV SOLN: 500.0000 mL | Freq: Once | INTRAVENOUS | Status: DC

## 2024-07-04 NOTE — Progress Notes (Signed)
 Called to room to assist during endoscopic procedure.  Patient ID and intended procedure confirmed with present staff. Received instructions for my participation in the procedure from the performing physician.

## 2024-07-04 NOTE — Progress Notes (Signed)
 "   GASTROENTEROLOGY PROCEDURE H&P NOTE   Primary Care Physician: Howell Lunger, DO    Reason for Procedure:   Hx of polyps  Plan:    Colonoscopy    Patient is appropriate for endoscopic procedure(s) in the ambulatory ([facility]) setting.  The nature of the procedure, as well as the risks, benefits, and alternatives were carefully and thoroughly reviewed with the patient. Ample time for discussion and questions allowed.  All questions were answered. The patient understood, was satisfied, and agreed with the plan to proceed.    HPI: Gregory Benson is a 64 y.o. male who presents for surveillance colonoscopy.  Medical history as below.  Tolerated the prep.  No recent chest pain or shortness of breath.  No abdominal pain today.  Past Medical History:  Diagnosis Date   Chest pain    Chronic headache    Chronic headaches    Hyperlipidemia    Hypertension     Past Surgical History:  Procedure Laterality Date   COLONOSCOPY     NO PAST SURGERIES      Prior to Admission medications  Medication Sig Start Date End Date Taking? Authorizing Provider  atorvastatin  (LIPITOR) 20 MG tablet Take 1 tablet (20 mg total) by mouth daily. 02/21/24  Yes Howell Lunger, DO  chlorthalidone  (HYGROTON ) 25 MG tablet Take 1 tablet (25 mg total) by mouth daily. Patient not taking: No sig reported 02/21/24   Howell Lunger, DO  diltiazem  (CARDIZEM  CD) 120 MG 24 hr capsule Take 1 capsule (120 mg total) by mouth daily. 02/21/24   Howell Lunger, DO  ketoconazole  (NIZORAL ) 2 % shampoo Apply 1 application topically 2 (two) times a week. Patient not taking: Reported on 03/21/2024 09/26/21   Dayna Motto, DO  lidocaine  (LIDODERM ) 5 % Place 1 patch onto the skin daily. Remove & Discard patch within 12 hours or as directed by MD 02/15/24   Keith, Kayla N, PA-C  methocarbamol  (ROBAXIN ) 500 MG tablet Take 2 tablets (1,000 mg total) by mouth 2 (two) times daily. 02/15/24   Keith, Kayla N, PA-C  Multiple Vitamin (MULTIVITAMIN  ADULT PO) Take by mouth. Patient not taking: No sig reported    [provider]  potassium chloride  (KLOR-CON  M) 10 MEQ tablet Take 1 tablet (10 mEq total) by mouth 2 (two) times daily. Patient not taking: No sig reported 03/31/24   Howell Lunger, DO  sildenafil  (VIAGRA ) 100 MG tablet Take 0.5 tablets (50 mg total) by mouth daily as needed for erectile dysfunction. Patient not taking: No sig reported 07/21/21   Christia Budds, MD    Current Outpatient Medications  Medication Sig Dispense Refill   atorvastatin  (LIPITOR) 20 MG tablet Take 1 tablet (20 mg total) by mouth daily. 90 tablet 2   chlorthalidone  (HYGROTON ) 25 MG tablet Take 1 tablet (25 mg total) by mouth daily. (Patient not taking: No sig reported) 90 tablet 2   diltiazem  (CARDIZEM  CD) 120 MG 24 hr capsule Take 1 capsule (120 mg total) by mouth daily. 90 capsule 3   ketoconazole  (NIZORAL ) 2 % shampoo Apply 1 application topically 2 (two) times a week. (Patient not taking: Reported on 03/21/2024) 120 mL 0   lidocaine  (LIDODERM ) 5 % Place 1 patch onto the skin daily. Remove & Discard patch within 12 hours or as directed by MD 30 patch 0   methocarbamol  (ROBAXIN ) 500 MG tablet Take 2 tablets (1,000 mg total) by mouth 2 (two) times daily. 20 tablet 0   Multiple Vitamin (MULTIVITAMIN ADULT PO)  Take by mouth. (Patient not taking: No sig reported)     potassium chloride  (KLOR-CON  M) 10 MEQ tablet Take 1 tablet (10 mEq total) by mouth 2 (two) times daily. (Patient not taking: No sig reported) 10 tablet 0   sildenafil  (VIAGRA ) 100 MG tablet Take 0.5 tablets (50 mg total) by mouth daily as needed for erectile dysfunction. (Patient not taking: No sig reported) 10 tablet 1   Current Facility-Administered Medications  Medication Dose Route Frequency Provider Last Rate Last Admin   0.9 %  sodium chloride  infusion  500 mL Intravenous Once Raelin Pixler, Gordy HERO, MD        Allergies as of 07/04/2024   (No Known Allergies)    Family History   Problem Relation Age of Onset   Stroke Father    Heart disease Brother    Stroke Paternal Grandfather    Colon cancer Neg Hx    Colon polyps Neg Hx    Esophageal cancer Neg Hx    Rectal cancer Neg Hx    Stomach cancer Neg Hx     Social History   Socioeconomic History   Marital status: Legally Separated    Spouse name: Not on file   Number of children: Not on file   Years of education: Not on file   Highest education level: Not on file  Occupational History   Occupation: Estate agent  Tobacco Use   Smoking status: Never   Smokeless tobacco: Never  Vaping Use   Vaping status: Never Used  Substance and Sexual Activity   Alcohol use: Not Currently    Comment: occ wine   Drug use: No   Sexual activity: Not Currently  Other Topics Concern   Not on file  Social History Narrative   Not on file   Social Drivers of Health   Tobacco Use: Low Risk (07/04/2024)   Patient History    Smoking Tobacco Use: Never    Smokeless Tobacco Use: Never    Passive Exposure: Not on file  Financial Resource Strain: Not on file  Food Insecurity: Not on file  Transportation Needs: Not on file  Physical Activity: Not on file  Stress: Not on file  Social Connections: Not on file  Intimate Partner Violence: Not on file  Depression (PHQ2-9): Low Risk (04/07/2024)   Depression (PHQ2-9)    PHQ-2 Score: 0  Alcohol Screen: Not on file  Housing: Not on file  Utilities: Not on file  Health Literacy: Not on file    Physical Exam: Vital signs in last 24 hours: @BP  131/78 (Cuff Size: Normal)   Pulse 65   Temp (!) 97.2 F (36.2 C)   Ht 5' 10 (1.778 m)   Wt 195 lb (88.5 kg)   SpO2 96%   BMI 27.98 kg/m  GEN: NAD EYE: Sclerae anicteric ENT: MMM CV: Non-tachycardic Pulm: CTA b/l GI: Soft, NT/ND NEURO:  Alert & Oriented x 3   Gordy Starch, MD  Gastroenterology  07/04/2024 7:58 AM  "

## 2024-07-04 NOTE — Op Note (Signed)
 [facility] Patient Name: Gregory Benson Procedure Date: 07/04/2024 7:10 AM MRN: [id number] Endoscopist: Gordy CHRISTELLA Starch , MD, [id number] Age: 64 Referring MD:  Date of Birth: 11-01-1960 Gender: Male Account #: [id number] Procedure:                Colonoscopy Indications:              High risk colon cancer surveillance: Personal                            history of multiple adenomas, Last colonoscopy:                            December 2021 (index, TA x 8) Medicines:                Monitored Anesthesia Care Procedure:                Pre-Anesthesia Assessment:                           - Prior to the procedure, a History and Physical                            was performed, and patient medications and                            allergies were reviewed. The patient's tolerance of                            previous anesthesia was also reviewed. The risks                            and benefits of the procedure and the sedation                            options and risks were discussed with the patient.                            All questions were answered, and informed consent                            was obtained. Prior Anticoagulants: The patient has                            taken no anticoagulant or antiplatelet agents. ASA                            Grade Assessment: II - A patient with mild systemic                            disease. After reviewing the risks and benefits,                            the patient was deemed in satisfactory condition to  undergo the procedure.                           After obtaining informed consent, the colonoscope                            was passed under direct vision. Throughout the                            procedure, the patient's blood pressure, pulse, and                            oxygen saturations were monitored continuously. The                            CF [id number] #[id number] was introduced through  the anus                            and advanced to the cecum, identified by                            appendiceal orifice and ileocecal valve. The                            colonoscopy was performed without difficulty. The                            patient tolerated the procedure well. The quality                            of the bowel preparation was good. The ileocecal                            valve, appendiceal orifice, and rectum were                            photographed. Scope In: 8:04:46 AM Scope Out: 8:18:15 AM Scope Withdrawal Time: 0 hours 11 minutes 40 seconds  Total Procedure Duration: 0 hours 13 minutes 29 seconds  Findings:                 The digital rectal exam was normal.                           A 3 mm polyp was found in the descending colon. The                            polyp was sessile. The polyp was removed with a                            cold snare. Resection and retrieval were complete.                           A 2 mm polyp was found in the distal rectum. The  polyp was sessile. The polyp was removed with a                            cold snare. Resection and retrieval were complete.                           Internal hemorrhoids were found during                            retroflexion. The hemorrhoids were small. Complications:            No immediate complications. Estimated Blood Loss:     Estimated blood loss was minimal. Impression:               - One 3 mm polyp in the descending colon, removed                            with a cold snare. Resected and retrieved.                           - One 2 mm polyp in the distal rectum, removed with                            a cold snare. Resected and retrieved.                           - Small internal hemorrhoids. Recommendation:           - Patient has a contact number available for                            emergencies. The signs and symptoms of potential                             delayed complications were discussed with the                            patient. Return to normal activities tomorrow.                            Written discharge instructions were provided to the                            patient.                           - Resume previous diet.                           - Continue present medications.                           - Await pathology results.                           - Repeat colonoscopy is recommended for  surveillance in 5 years. The colonoscopy date will                            be determined after pathology results from today's                            exam become available for review. Gordy CHRISTELLA Starch, MD 07/04/2024 8:29:54 AM This report has been signed electronically.

## 2024-07-04 NOTE — Patient Instructions (Addendum)
Resume previous diet. Continue present medications. Await pathology results. Repeat colonoscopy in 5 years for surveillance.  YOU HAD AN ENDOSCOPIC PROCEDURE TODAY AT THE [facility]:   Refer to the procedure report that was given to you for any specific questions about what was found during the examination.  If the procedure report does not answer your questions, please call your gastroenterologist to clarify.  If you requested that your care partner not be given the details of your procedure findings, then the procedure report has been included in a sealed envelope for you to review at your convenience later.  YOU SHOULD EXPECT: Some feelings of bloating in the abdomen. Passage of more gas than usual.  Walking can help get rid of the air that was put into your GI tract during the procedure and reduce the bloating. If you had a lower endoscopy (such as a colonoscopy or flexible sigmoidoscopy) you may notice spotting of blood in your stool or on the toilet paper. If you underwent a bowel prep for your procedure, you may not have a normal bowel movement for a few days.  Please Note:  You might notice some irritation and congestion in your nose or some drainage.  This is from the oxygen used during your procedure.  There is no need for concern and it should clear up in a day or so.  SYMPTOMS TO REPORT IMMEDIATELY:  Following lower endoscopy (colonoscopy or flexible sigmoidoscopy):  Excessive amounts of blood in the stool  Significant tenderness or worsening of abdominal pains  Swelling of the abdomen that is new, acute  Fever of 100F or higher   For urgent or emergent issues, a gastroenterologist can be reached at any hour by calling [phone]. Do not use MyChart messaging for urgent concerns.    DIET:  We do recommend a small meal at first, but then you may proceed to your regular diet.  Drink plenty of fluids but you should avoid alcoholic beverages for 24  hours.  ACTIVITY:  You should plan to take it easy for the rest of today and you should NOT DRIVE or use heavy machinery until tomorrow (because of the sedation medicines used during the test).    FOLLOW UP: Our staff will call the number listed on your records the next business day following your procedure.  We will call around 7:15- 8:00 am to check on you and address any questions or concerns that you may have regarding the information given to you following your procedure. If we do not reach you, we will leave a message.     If any biopsies were taken you will be contacted by phone or by letter within the next 1-3 weeks.  Please call us at [phone] if you have not heard about the biopsies in 3 weeks.    SIGNATURES/CONFIDENTIALITY: You and/or your care partner have signed paperwork which will be entered into your electronic medical record.  These signatures attest to the fact that that the information above on your After Visit Summary has been reviewed and is understood.  Full responsibility of the confidentiality of this discharge information lies with you and/or your care-partner.

## 2024-07-04 NOTE — Progress Notes (Signed)
 Pt's states no medical or surgical changes since previsit or office visit.

## 2024-07-04 NOTE — Addendum Note (Signed)
 Addended by: GARVIN RICHARDA POUR on: 07/04/2024 09:52 AM   Modules accepted: Orders

## 2024-07-04 NOTE — Progress Notes (Signed)
 Report to PACU, RN, vss, BBS= Clear.

## 2024-07-08 ENCOUNTER — Telehealth: Payer: Self-pay | Admitting: *Deleted

## 2024-07-08 NOTE — Telephone Encounter (Signed)
 No answer on follow up call. Left message.

## 2024-07-09 ENCOUNTER — Ambulatory Visit: Payer: Self-pay | Admitting: Internal Medicine

## 2024-07-09 LAB — SURGICAL PATHOLOGY

## 2024-07-18 ENCOUNTER — Other Ambulatory Visit (HOSPITAL_COMMUNITY): Payer: Self-pay

## 2024-08-15 ENCOUNTER — Ambulatory Visit: Admitting: Cardiology
